# Patient Record
Sex: Male | Born: 1980 | Race: Black or African American | Hispanic: No | Marital: Single | Smoking: Never smoker
Health system: Southern US, Community
[De-identification: ages and names within clinical notes are randomized; demographics above are authoritative.]

## PROBLEM LIST (undated history)

## (undated) DIAGNOSIS — R1310 Dysphagia, unspecified: Secondary | ICD-10-CM

## (undated) DIAGNOSIS — S52502A Unspecified fracture of the lower end of left radius, initial encounter for closed fracture: Secondary | ICD-10-CM

## (undated) DIAGNOSIS — R198 Other specified symptoms and signs involving the digestive system and abdomen: Secondary | ICD-10-CM

---

## 2016-11-04 ENCOUNTER — Emergency Department: Payer: BLUE CROSS/BLUE SHIELD

## 2016-11-04 ENCOUNTER — Emergency Department
Admission: EM | Admit: 2016-11-04 | Discharge: 2016-11-04 | Disposition: A | Discharge status: Home / Self Care | Payer: BLUE CROSS/BLUE SHIELD | Attending: Emergency Medicine | Admitting: Emergency Medicine

## 2016-11-04 ENCOUNTER — Encounter: Payer: Self-pay | Admitting: Emergency Medicine

## 2016-11-04 DIAGNOSIS — W01198A Fall on same level from slipping, tripping and stumbling with subsequent striking against other object, initial encounter: Secondary | ICD-10-CM | POA: Insufficient documentation

## 2016-11-04 DIAGNOSIS — Y9389 Activity, other specified: Secondary | ICD-10-CM | POA: Insufficient documentation

## 2016-11-04 DIAGNOSIS — S0101XA Laceration without foreign body of scalp, initial encounter: Secondary | ICD-10-CM | POA: Diagnosis not present

## 2016-11-04 DIAGNOSIS — Y929 Unspecified place or not applicable: Secondary | ICD-10-CM | POA: Diagnosis not present

## 2016-11-04 DIAGNOSIS — S0990XA Unspecified injury of head, initial encounter: Secondary | ICD-10-CM | POA: Diagnosis present

## 2016-11-04 DIAGNOSIS — Y999 Unspecified external cause status: Secondary | ICD-10-CM | POA: Diagnosis not present
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
## 2016-11-21 ENCOUNTER — Encounter (HOSPITAL_COMMUNITY): Admission: EM | Disposition: A | Discharge status: Rehabilitation Facility | Payer: Self-pay | Source: Home / Self Care

## 2016-11-21 ENCOUNTER — Observation Stay (HOSPITAL_COMMUNITY): Payer: BLUE CROSS/BLUE SHIELD

## 2016-11-21 ENCOUNTER — Observation Stay (HOSPITAL_COMMUNITY): Payer: BLUE CROSS/BLUE SHIELD | Admitting: Anesthesiology

## 2016-11-21 ENCOUNTER — Emergency Department (HOSPITAL_COMMUNITY): Payer: BLUE CROSS/BLUE SHIELD

## 2016-11-21 ENCOUNTER — Encounter (HOSPITAL_COMMUNITY): Payer: Self-pay

## 2016-11-21 ENCOUNTER — Inpatient Hospital Stay (HOSPITAL_COMMUNITY)
Admission: EM | Admit: 2016-11-21 | Discharge: 2016-11-27 | DRG: 480 | Disposition: A | Discharge status: Rehabilitation Facility | Payer: BLUE CROSS/BLUE SHIELD | Attending: Orthopedic Surgery | Admitting: Orthopedic Surgery

## 2016-11-21 DIAGNOSIS — S52502A Unspecified fracture of the lower end of left radius, initial encounter for closed fracture: Secondary | ICD-10-CM

## 2016-11-21 DIAGNOSIS — S52572A Other intraarticular fracture of lower end of left radius, initial encounter for closed fracture: Secondary | ICD-10-CM | POA: Diagnosis not present

## 2016-11-21 DIAGNOSIS — S7292XA Unspecified fracture of left femur, initial encounter for closed fracture: Secondary | ICD-10-CM

## 2016-11-21 DIAGNOSIS — R52 Pain, unspecified: Secondary | ICD-10-CM

## 2016-11-21 DIAGNOSIS — Z419 Encounter for procedure for purposes other than remedying health state, unspecified: Secondary | ICD-10-CM

## 2016-11-21 DIAGNOSIS — S72142A Displaced intertrochanteric fracture of left femur, initial encounter for closed fracture: Secondary | ICD-10-CM

## 2016-11-21 DIAGNOSIS — G8918 Other acute postprocedural pain: Secondary | ICD-10-CM

## 2016-11-21 DIAGNOSIS — R739 Hyperglycemia, unspecified: Secondary | ICD-10-CM | POA: Diagnosis present

## 2016-11-21 DIAGNOSIS — R42 Dizziness and giddiness: Secondary | ICD-10-CM

## 2016-11-21 DIAGNOSIS — I951 Orthostatic hypotension: Secondary | ICD-10-CM

## 2016-11-21 DIAGNOSIS — S72352A Displaced comminuted fracture of shaft of left femur, initial encounter for closed fracture: Secondary | ICD-10-CM | POA: Diagnosis present

## 2016-11-21 DIAGNOSIS — S80211A Abrasion, right knee, initial encounter: Secondary | ICD-10-CM | POA: Diagnosis present

## 2016-11-21 DIAGNOSIS — S90511A Abrasion, right ankle, initial encounter: Secondary | ICD-10-CM | POA: Diagnosis present

## 2016-11-21 DIAGNOSIS — D62 Acute posthemorrhagic anemia: Secondary | ICD-10-CM | POA: Diagnosis not present

## 2016-11-21 HISTORY — DX: Unspecified fracture of the lower end of left radius, initial encounter for closed fracture: S52.502A

## 2016-11-21 HISTORY — PX: CLOSED REDUCTION WRIST FRACTURE: SHX1091

## 2016-11-21 HISTORY — PX: FEMUR IM NAIL: SHX1597

## 2016-11-21 LAB — SAMPLE TO BLOOD BANK

## 2016-11-21 SURGERY — INSERTION, INTRAMEDULLARY ROD, FEMUR
Anesthesia: General | Site: Wrist | Laterality: Left

## 2016-11-21 MED ORDER — MORPHINE SULFATE (PF) 4 MG/ML IV SOLN
1.0000 mg | INTRAVENOUS | Status: DC | PRN
  Administered 2016-11-22: 4 mg via INTRAVENOUS
  Filled 2016-11-21: qty 1

## 2016-11-21 MED ORDER — MEPERIDINE HCL 25 MG/ML IJ SOLN
INTRAMUSCULAR | Status: AC
  Administered 2016-11-21: 12.5 mg via INTRAVENOUS
  Filled 2016-11-21: qty 1

## 2016-11-21 MED ORDER — OXYCODONE HCL 5 MG/5ML PO SOLN: 5.0000 mg | Freq: Once | ORAL | Status: DC | PRN

## 2016-11-21 MED ORDER — SODIUM CHLORIDE 0.9 % IV SOLN: INTRAVENOUS | Status: DC

## 2016-11-21 MED ORDER — HYDROMORPHONE HCL 1 MG/ML IJ SOLN
0.2500 mg | INTRAMUSCULAR | Status: DC | PRN
  Administered 2016-11-21: 0.5 mg via INTRAVENOUS

## 2016-11-21 MED ORDER — BISACODYL 10 MG RE SUPP: 10.0000 mg | Freq: Every day | RECTAL | Status: DC | PRN

## 2016-11-21 MED ORDER — ONDANSETRON HCL 4 MG PO TABS: 4.0000 mg | ORAL_TABLET | Freq: Four times a day (QID) | ORAL | Status: DC | PRN

## 2016-11-21 MED ORDER — IOPAMIDOL (ISOVUE-300) INJECTION 61%
INTRAVENOUS | Status: AC
  Administered 2016-11-21: 100 mL
  Filled 2016-11-21: qty 100

## 2016-11-21 MED ORDER — MAGNESIUM CITRATE PO SOLN: 1.0000 | Freq: Once | ORAL | Status: DC | PRN

## 2016-11-21 MED ORDER — METOCLOPRAMIDE HCL 5 MG/ML IJ SOLN: 5.0000 mg | Freq: Three times a day (TID) | INTRAMUSCULAR | Status: DC | PRN

## 2016-11-21 MED ORDER — MIDAZOLAM HCL 5 MG/5ML IJ SOLN
INTRAMUSCULAR | Status: DC | PRN
  Administered 2016-11-21: 2 mg via INTRAVENOUS

## 2016-11-21 MED ORDER — LIDOCAINE 2% (20 MG/ML) 5 ML SYRINGE
INTRAMUSCULAR | Status: DC | PRN
  Administered 2016-11-21: 100 mg via INTRAVENOUS

## 2016-11-21 MED ORDER — DEXAMETHASONE SODIUM PHOSPHATE 10 MG/ML IJ SOLN
INTRAMUSCULAR | Status: DC | PRN
  Administered 2016-11-21: 10 mg via INTRAVENOUS

## 2016-11-21 MED ORDER — HYDROMORPHONE HCL 1 MG/ML IJ SOLN
INTRAMUSCULAR | Status: AC
  Administered 2016-11-21: 0.5 mg via INTRAVENOUS
  Filled 2016-11-21: qty 1

## 2016-11-21 MED ORDER — SODIUM CHLORIDE 0.9 % IV BOLUS (SEPSIS)
1000.0000 mL | Freq: Once | INTRAVENOUS | Status: AC
  Administered 2016-11-21: 1000 mL via INTRAVENOUS

## 2016-11-21 MED ORDER — MEPERIDINE HCL 25 MG/ML IJ SOLN
6.2500 mg | INTRAMUSCULAR | Status: DC | PRN
  Administered 2016-11-21: 12.5 mg via INTRAVENOUS

## 2016-11-21 MED ORDER — SODIUM CHLORIDE 0.9 % IV SOLN
INTRAVENOUS | Status: DC
  Administered 2016-11-21 – 2016-11-23 (×2): via INTRAVENOUS

## 2016-11-21 MED ORDER — PHENYLEPHRINE HCL 10 MG/ML IJ SOLN
INTRAMUSCULAR | Status: DC | PRN
Start: 2016-11-21 — End: 2016-11-21
  Administered 2016-11-21 (×2): 80 ug via INTRAVENOUS

## 2016-11-21 MED ORDER — METOPROLOL TARTRATE 5 MG/5ML IV SOLN
INTRAVENOUS | Status: DC | PRN
  Administered 2016-11-21: 1 mg via INTRAVENOUS

## 2016-11-21 MED ORDER — METHOCARBAMOL 500 MG PO TABS
500.0000 mg | ORAL_TABLET | Freq: Four times a day (QID) | ORAL | Status: DC | PRN
  Administered 2016-11-22 – 2016-11-27 (×8): 500 mg via ORAL
  Filled 2016-11-21 (×8): qty 1

## 2016-11-21 MED ORDER — SUFENTANIL CITRATE 50 MCG/ML IV SOLN
INTRAVENOUS | Status: AC
  Filled 2016-11-21: qty 1

## 2016-11-21 MED ORDER — PROMETHAZINE HCL 25 MG/ML IJ SOLN: 6.2500 mg | INTRAMUSCULAR | Status: DC | PRN

## 2016-11-21 MED ORDER — KETOROLAC TROMETHAMINE 30 MG/ML IJ SOLN
30.0000 mg | Freq: Once | INTRAMUSCULAR | Status: DC | PRN
  Administered 2016-11-21: 30 mg via INTRAVENOUS

## 2016-11-21 MED ORDER — PROPOFOL 10 MG/ML IV BOLUS
INTRAVENOUS | Status: DC | PRN
  Administered 2016-11-21: 150 mg via INTRAVENOUS
  Administered 2016-11-21: 50 mg via INTRAVENOUS

## 2016-11-21 MED ORDER — METHOCARBAMOL 1000 MG/10ML IJ SOLN
500.0000 mg | Freq: Four times a day (QID) | INTRAVENOUS | Status: DC | PRN
  Filled 2016-11-21: qty 5

## 2016-11-21 MED ORDER — OXYCODONE HCL 5 MG PO TABS
5.0000 mg | ORAL_TABLET | ORAL | Status: DC | PRN
  Administered 2016-11-22 (×3): 10 mg via ORAL
  Administered 2016-11-22: 5 mg via ORAL
  Administered 2016-11-23 – 2016-11-27 (×15): 10 mg via ORAL
  Filled 2016-11-21 (×19): qty 2

## 2016-11-21 MED ORDER — FENTANYL CITRATE (PF) 100 MCG/2ML IJ SOLN
INTRAMUSCULAR | Status: AC
  Filled 2016-11-21: qty 2

## 2016-11-21 MED ORDER — CEFAZOLIN SODIUM 1 G IJ SOLR
INTRAMUSCULAR | Status: AC
  Filled 2016-11-21: qty 20

## 2016-11-21 MED ORDER — FENTANYL CITRATE (PF) 100 MCG/2ML IJ SOLN
INTRAMUSCULAR | Status: AC | PRN
  Administered 2016-11-21 (×2): 50 ug via INTRAVENOUS

## 2016-11-21 MED ORDER — SODIUM CHLORIDE 0.45 % IV SOLN: INTRAVENOUS | Status: DC

## 2016-11-21 MED ORDER — DOCUSATE SODIUM 100 MG PO CAPS
100.0000 mg | ORAL_CAPSULE | Freq: Two times a day (BID) | ORAL | Status: DC
  Administered 2016-11-22 – 2016-11-26 (×9): 100 mg via ORAL
  Filled 2016-11-21 (×10): qty 1

## 2016-11-21 MED ORDER — 0.9 % SODIUM CHLORIDE (POUR BTL) OPTIME
TOPICAL | Status: DC | PRN
  Administered 2016-11-21: 1000 mL

## 2016-11-21 MED ORDER — LACTATED RINGERS IV SOLN
INTRAVENOUS | Status: DC | PRN
  Administered 2016-11-21 (×2): via INTRAVENOUS

## 2016-11-21 MED ORDER — KETOROLAC TROMETHAMINE 30 MG/ML IJ SOLN
INTRAMUSCULAR | Status: AC
  Administered 2016-11-21: 30 mg via INTRAVENOUS
  Filled 2016-11-21: qty 1

## 2016-11-21 MED ORDER — ONDANSETRON HCL 4 MG/2ML IJ SOLN
INTRAMUSCULAR | Status: DC | PRN
  Administered 2016-11-21: 4 mg via INTRAVENOUS

## 2016-11-21 MED ORDER — HYDROMORPHONE HCL 1 MG/ML IJ SOLN
1.0000 mg | INTRAMUSCULAR | Status: DC | PRN
  Administered 2016-11-22: 1 mg via INTRAVENOUS
  Filled 2016-11-21: qty 1

## 2016-11-21 MED ORDER — METOCLOPRAMIDE HCL 5 MG PO TABS: 5.0000 mg | ORAL_TABLET | Freq: Three times a day (TID) | ORAL | Status: DC | PRN

## 2016-11-21 MED ORDER — DOCUSATE SODIUM 100 MG PO CAPS: 100.0000 mg | ORAL_CAPSULE | Freq: Two times a day (BID) | ORAL | Status: DC

## 2016-11-21 MED ORDER — ACETAMINOPHEN 325 MG PO TABS: 650.0000 mg | ORAL_TABLET | Freq: Four times a day (QID) | ORAL | Status: DC | PRN

## 2016-11-21 MED ORDER — CEFAZOLIN SODIUM 1 G IJ SOLR
INTRAMUSCULAR | Status: DC | PRN
  Administered 2016-11-21: 2 g via INTRAMUSCULAR

## 2016-11-21 MED ORDER — ONDANSETRON HCL 4 MG/2ML IJ SOLN: 4.0000 mg | Freq: Four times a day (QID) | INTRAMUSCULAR | Status: DC | PRN

## 2016-11-21 MED ORDER — CEFAZOLIN IN D5W 1 GM/50ML IV SOLN
1.0000 g | Freq: Four times a day (QID) | INTRAVENOUS | Status: AC
  Administered 2016-11-22 (×3): 1 g via INTRAVENOUS
  Filled 2016-11-21 (×3): qty 50

## 2016-11-21 MED ORDER — ACETAMINOPHEN 650 MG RE SUPP
650.0000 mg | Freq: Four times a day (QID) | RECTAL | Status: DC | PRN
Start: 2016-11-21 — End: 2016-11-22

## 2016-11-21 MED ORDER — OXYCODONE HCL 5 MG PO TABS: 5.0000 mg | ORAL_TABLET | Freq: Once | ORAL | Status: DC | PRN

## 2016-11-21 MED ORDER — MIDAZOLAM HCL 2 MG/2ML IJ SOLN
INTRAMUSCULAR | Status: AC
  Filled 2016-11-21: qty 2

## 2016-11-21 MED ORDER — SUFENTANIL CITRATE 50 MCG/ML IV SOLN
INTRAVENOUS | Status: DC | PRN
  Administered 2016-11-21: 20 ug via INTRAVENOUS

## 2016-11-21 MED ORDER — POLYETHYLENE GLYCOL 3350 17 G PO PACK: 17.0000 g | PACK | Freq: Every day | ORAL | Status: DC | PRN

## 2016-11-21 MED ORDER — SUCCINYLCHOLINE CHLORIDE 200 MG/10ML IV SOSY
PREFILLED_SYRINGE | INTRAVENOUS | Status: DC | PRN
  Administered 2016-11-21: 120 mg via INTRAVENOUS

## 2016-11-21 MED ORDER — HYDROMORPHONE HCL 1 MG/ML IJ SOLN
2.0000 mg | Freq: Once | INTRAMUSCULAR | Status: AC
  Administered 2016-11-21: 2 mg via INTRAVENOUS
  Filled 2016-11-21: qty 2

## 2016-11-21 SURGICAL SUPPLY — 79 items
BANDAGE ACE 3X5.8 VEL STRL LF (GAUZE/BANDAGES/DRESSINGS) ×5 IMPLANT
BANDAGE ACE 4X5 VEL STRL LF (GAUZE/BANDAGES/DRESSINGS) ×5 IMPLANT
BIT DRILL 4.2 (DRILL) ×3 IMPLANT
BIT DRILL FLUTED FEMUR 4.2/3 (BIT) ×5 IMPLANT
BLADE SURG 15 STRL LF DISP TIS (BLADE) ×3 IMPLANT
BLADE SURG 15 STRL SS (BLADE) ×2
BNDG COHESIVE 4X5 TAN STRL (GAUZE/BANDAGES/DRESSINGS) ×5 IMPLANT
BNDG COHESIVE 6X5 TAN STRL LF (GAUZE/BANDAGES/DRESSINGS) ×10 IMPLANT
BNDG ESMARK 4X9 LF (GAUZE/BANDAGES/DRESSINGS) IMPLANT
BNDG GAUZE ELAST 4 BULKY (GAUZE/BANDAGES/DRESSINGS) ×5 IMPLANT
CLOSURE WOUND 1/2 X4 (GAUZE/BANDAGES/DRESSINGS)
COVER BACK TABLE 60X90IN (DRAPES) ×5 IMPLANT
COVER PERINEAL POST (MISCELLANEOUS) ×5 IMPLANT
COVER SURGICAL LIGHT HANDLE (MISCELLANEOUS) ×10 IMPLANT
CUFF TOURNIQUET SINGLE 18IN (TOURNIQUET CUFF) IMPLANT
CUFF TOURNIQUET SINGLE 24IN (TOURNIQUET CUFF) IMPLANT
DRAPE C-ARM 42X72 X-RAY (DRAPES) IMPLANT
DRAPE IMP U-DRAPE 54X76 (DRAPES) ×5 IMPLANT
DRAPE INCISE IOBAN 66X45 STRL (DRAPES) IMPLANT
DRAPE OEC MINIVIEW 54X84 (DRAPES) IMPLANT
DRAPE STERI IOBAN 125X83 (DRAPES) ×5 IMPLANT
DRAPE U-SHAPE 47X51 STRL (DRAPES) IMPLANT
DRILL 4.2 (DRILL) ×5
DRSG ADAPTIC 3X8 NADH LF (GAUZE/BANDAGES/DRESSINGS) ×5 IMPLANT
DRSG AQUACEL AG ADV 3.5X 4 (GAUZE/BANDAGES/DRESSINGS) ×5 IMPLANT
DRSG AQUACEL AG ADV 3.5X 6 (GAUZE/BANDAGES/DRESSINGS) ×5 IMPLANT
DRSG EMULSION OIL 3X3 NADH (GAUZE/BANDAGES/DRESSINGS) IMPLANT
DRSG MEPILEX BORDER 4X4 (GAUZE/BANDAGES/DRESSINGS) ×5 IMPLANT
DRSG MEPILEX BORDER 4X8 (GAUZE/BANDAGES/DRESSINGS) ×5 IMPLANT
DRSG PAD ABDOMINAL 8X10 ST (GAUZE/BANDAGES/DRESSINGS) IMPLANT
DURAPREP 26ML APPLICATOR (WOUND CARE) ×5 IMPLANT
ELECT REM PT RETURN 9FT ADLT (ELECTROSURGICAL) ×5
ELECTRODE REM PT RTRN 9FT ADLT (ELECTROSURGICAL) ×3 IMPLANT
EVACUATOR 1/8 PVC DRAIN (DRAIN) IMPLANT
GAUZE SPONGE 4X4 12PLY STRL (GAUZE/BANDAGES/DRESSINGS) IMPLANT
GAUZE SPONGE 4X4 16PLY XRAY LF (GAUZE/BANDAGES/DRESSINGS) IMPLANT
GLOVE BIOGEL PI IND STRL 9 (GLOVE) ×3 IMPLANT
GLOVE BIOGEL PI INDICATOR 9 (GLOVE) ×2
GLOVE SURG ORTHO 9.0 STRL STRW (GLOVE) ×5 IMPLANT
GOWN STRL REUS W/ TWL XL LVL3 (GOWN DISPOSABLE) ×9 IMPLANT
GOWN STRL REUS W/TWL XL LVL3 (GOWN DISPOSABLE) ×6
KIT BASIN OR (CUSTOM PROCEDURE TRAY) ×5 IMPLANT
KIT ROOM TURNOVER OR (KITS) ×5 IMPLANT
LINER BOOT UNIVERSAL DISP (MISCELLANEOUS) IMPLANT
MANIFOLD NEPTUNE II (INSTRUMENTS) IMPLANT
NAIL FEM RECON 10X420 LT (Nail) ×5 IMPLANT
NS IRRIG 1000ML POUR BTL (IV SOLUTION) ×5 IMPLANT
PACK GENERAL/GYN (CUSTOM PROCEDURE TRAY) ×5 IMPLANT
PACK ORTHO EXTREMITY (CUSTOM PROCEDURE TRAY) IMPLANT
PAD ARMBOARD 7.5X6 YLW CONV (MISCELLANEOUS) ×20 IMPLANT
PAD CAST 4YDX4 CTTN HI CHSV (CAST SUPPLIES) IMPLANT
PADDING CAST COTTON 4X4 STRL (CAST SUPPLIES)
REAMER ROD DEEP FLUTE 2.5X950 (INSTRUMENTS) ×5 IMPLANT
SCREW LOCK T25 FT 60X5X4.3X (Screw) ×3 IMPLANT
SCREW LOCKING 5.0MMX40MM (Screw) ×5 IMPLANT
SCREW LOCKING 5.0X60MM (Screw) ×2 IMPLANT
SPLINT FIBERGLASS 3X35 (CAST SUPPLIES) ×5 IMPLANT
SPONGE LAP 18X18 X RAY DECT (DISPOSABLE) ×5 IMPLANT
STAPLER VISISTAT 35W (STAPLE) ×5 IMPLANT
STRIP CLOSURE SKIN 1/2X4 (GAUZE/BANDAGES/DRESSINGS) IMPLANT
SUCTION FRAZIER HANDLE 10FR (MISCELLANEOUS)
SUCTION TUBE FRAZIER 10FR DISP (MISCELLANEOUS) IMPLANT
SUT PROLENE 3 0 PS 1 (SUTURE) ×5 IMPLANT
SUT VIC AB 0 CT1 27 (SUTURE) ×2
SUT VIC AB 0 CT1 27XBRD ANBCTR (SUTURE) ×3 IMPLANT
SUT VIC AB 1 CT1 27 (SUTURE) ×2
SUT VIC AB 1 CT1 27XBRD ANBCTR (SUTURE) ×3 IMPLANT
SUT VIC AB 2-0 CT1 27 (SUTURE) ×2
SUT VIC AB 2-0 CT1 TAPERPNT 27 (SUTURE) ×3 IMPLANT
SUT VIC AB 2-0 CTB1 (SUTURE) IMPLANT
SUT VIC AB 3-0 X1 27 (SUTURE) ×5 IMPLANT
SUT VICRYL 4-0 PS2 18IN ABS (SUTURE) IMPLANT
SYR BULB 3OZ (MISCELLANEOUS) IMPLANT
TOWEL OR 17X24 6PK STRL BLUE (TOWEL DISPOSABLE) ×5 IMPLANT
TOWEL OR 17X26 10 PK STRL BLUE (TOWEL DISPOSABLE) ×5 IMPLANT
TUBE CONNECTING 12'X1/4 (SUCTIONS)
TUBE CONNECTING 12X1/4 (SUCTIONS) IMPLANT
WATER STERILE IRR 1000ML POUR (IV SOLUTION) IMPLANT
YANKAUER SUCT BULB TIP NO VENT (SUCTIONS) IMPLANT

## 2016-11-21 NOTE — ED Notes (Signed)
Attempted report x 2 

## 2016-11-21 NOTE — ED Notes (Signed)
Pt clothing and cell phone given to father.
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
## 2016-11-21 NOTE — Anesthesia Preprocedure Evaluation (Signed)
Anesthesia Evaluation  Patient identified by MRN, date of birth, ID band Patient awake    Reviewed: Allergy & Precautions, NPO status , Patient's Chart, lab work & pertinent test results  Airway Mallampati: II  TM Distance: >3 FB Neck ROM: Full    Dental no notable dental hx.    Pulmonary neg pulmonary ROS,    Pulmonary exam normal breath sounds clear to auscultation       Cardiovascular negative cardio ROS Normal cardiovascular exam Rhythm:Regular Rate:Normal     Neuro/Psych negative neurological ROS  negative psych ROS   GI/Hepatic negative GI ROS, Neg liver ROS,   Endo/Other  negative endocrine ROS  Renal/GU negative Renal ROS     Musculoskeletal negative musculoskeletal ROS (+)   Abdominal   Peds  Hematology negative hematology ROS (+)   Anesthesia Other Findings   Reproductive/Obstetrics negative OB ROS                             Anesthesia Physical Anesthesia Plan  ASA: II  Anesthesia Plan: General   Post-op Pain Management:    Induction: Intravenous  Airway Management Planned: Oral ETT  Additional Equipment:   Intra-op Plan:   Post-operative Plan: Extubation in OR  Informed Consent: I have reviewed the patients History and Physical, chart, labs and discussed the procedure including the risks, benefits and alternatives for the proposed anesthesia with the patient or authorized representative who has indicated his/her understanding and acceptance.   Dental advisory given  Plan Discussed with: CRNA  Anesthesia Plan Comments:         Anesthesia Quick Evaluation  

## 2016-11-21 NOTE — Progress Notes (Signed)
Orthopedic Tech Progress Note Patient Details:  Tramain Gershman 28-Sep-1980 [id number]  Musculoskeletal Traction Type of Traction: Gilberto Better Traction Traction Location: Murlean Caller, Lonzo Saulter 11/21/2016, 4:12 PM

## 2016-11-21 NOTE — Anesthesia Procedure Notes (Signed)
Procedure Name: Intubation Date/Time: 11/21/2016 7:25 PM Performed by: Claris Che Pre-anesthesia Checklist: Patient identified, Emergency Drugs available, Suction available, Patient being monitored and Timeout performed Patient Re-evaluated:Patient Re-evaluated prior to inductionOxygen Delivery Method: Circle system utilized Preoxygenation: Pre-oxygenation with 100% oxygen Intubation Type: IV induction, Rapid sequence and Cricoid Pressure applied Laryngoscope Size: Mac and 3 Grade View: Grade II Tube type: Oral Tube size: 7.5 mm Airway Equipment and Method: Stylet Placement Confirmation: ETT inserted through vocal cords under direct vision,  positive ETCO2 and breath sounds checked- equal and bilateral Secured at: 23 cm Tube secured with: Tape Dental Injury: Teeth and Oropharynx as per pre-operative assessment

## 2016-11-21 NOTE — Transfer of Care (Signed)
Immediate Anesthesia Transfer of Care Note  Patient: Brandon Holloway  Procedure(s) Performed: Procedure(s): INTRAMEDULLARY (IM) NAIL FEMORAL (Left) CLOSED REDUCTION WRIST (Left)  Patient Location: PACU  Anesthesia Type:General  Level of Consciousness: sedated, patient cooperative and responds to stimulation  Airway & Oxygen Therapy: Patient Spontanous Breathing and Patient connected to nasal cannula oxygen  Post-op Assessment: Report given to RN, Post -op Vital signs reviewed and stable and Patient moving all extremities X 4  Post vital signs: Reviewed and stable  Last Vitals:  Vitals:   11/21/16 1730 11/21/16 1745  BP: 111/67 118/77  Pulse: 99 98  Resp: (!) 24 17  Temp:      Last Pain:  Vitals:   11/21/16 1655  TempSrc:   PainSc: 9          Complications: No apparent anesthesia complications

## 2016-11-21 NOTE — Op Note (Signed)
[id number] 
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
## 2016-11-21 NOTE — ED Notes (Signed)
Report given to OR RN.

## 2016-11-21 NOTE — Op Note (Signed)
[name], Brandon Holloway NO.:  [id number]  MEDICAL RECORD NO.:  [id number]  LOCATION:  TRAAC                        FACILITY:  [facility]  PHYSICIAN:  Betha Loa, MD        DATE OF BIRTH:  05/03/1981  DATE OF PROCEDURE:  11/21/2016 DATE OF DISCHARGE:                              OPERATIVE REPORT   PREOPERATIVE DIAGNOSIS:  Left comminuted intraarticular distal radius fracture.  POSTOPERATIVE DIAGNOSIS:  Left comminuted intraarticular distal radius fracture.  PROCEDURE:  Closed reduction and splinting of left comminuted intraarticular distal radius fracture.  SURGEON:  Betha Loa, MD.  ASSISTANT:  None.  ANESTHESIA:  General.  IV FLUIDS:  Per anesthesia flow sheet.  ESTIMATED BLOOD LOSS:  None.  COMPLICATIONS:  None.  SPECIMENS:  None.  TOURNIQUET:  None.  DISPOSITION:  Remains in OR for further procedures.  OPERATIVE COURSE:  After being identified preoperatively by myself, the patient and I agreed upon procedure and site of procedure.  Surgical site was marked.  The risks, benefits, and alternatives of surgery were reviewed and he wished to proceed.  Surgical consent had been signed. He was transferred to the operating room and placed on the operating room table.  General anesthesia induced by Anesthesiology.  A surgical pause was performed between surgeons, anesthesia, and operating staff; and all were in agreement as to the patient, procedure and site of procedure.  C-arm was used in AP and lateral projections throughout the procedure.  A closed reduction of the left comminuted intraarticular distal radius fracture was performed.  There was displacement of his carpus.  This was reduced.  A sugar-tong splint was placed.  Radiographs were taken through the splint showed acceptable maintained reduction. Fingertips were all pink with brisk capillary refill after reduction and splinting.  He tolerated the procedure well.  He remained in the operating  room for further procedures on his lower extremity, which will be dictated under a separate note.  We anticipate further surgical procedure on the distal radius for operative stabilization at a later date.     Betha Loa, MD     KK/MEDQ  D:  11/21/2016  T:  11/21/2016  Job:  [id number]
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
## 2016-11-21 NOTE — Progress Notes (Signed)
Orthopedic Tech Progress Note Patient Details:  [name] Jun 06, 1981 [id number]  Patient ID: [name], male   DOB: 06-05-1981, 36 y.o.   MRN: [id number]   [name] 11/21/2016, 4:13 PM As ordered by Dr. Fayrene Fearing wYATT
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
## 2016-11-21 NOTE — ED Notes (Signed)
Attempted report x1. 

## 2016-11-22 ENCOUNTER — Encounter (HOSPITAL_COMMUNITY): Payer: Self-pay

## 2016-11-22 ENCOUNTER — Observation Stay (HOSPITAL_COMMUNITY): Payer: BLUE CROSS/BLUE SHIELD

## 2016-11-22 DIAGNOSIS — S8992XA Unspecified injury of left lower leg, initial encounter: Secondary | ICD-10-CM | POA: Diagnosis not present

## 2016-11-22 DIAGNOSIS — D62 Acute posthemorrhagic anemia: Secondary | ICD-10-CM | POA: Diagnosis not present

## 2016-11-22 DIAGNOSIS — R42 Dizziness and giddiness: Secondary | ICD-10-CM | POA: Diagnosis not present

## 2016-11-22 DIAGNOSIS — F4323 Adjustment disorder with mixed anxiety and depressed mood: Secondary | ICD-10-CM | POA: Diagnosis not present

## 2016-11-22 DIAGNOSIS — R269 Unspecified abnormalities of gait and mobility: Secondary | ICD-10-CM | POA: Diagnosis not present

## 2016-11-22 DIAGNOSIS — I951 Orthostatic hypotension: Secondary | ICD-10-CM | POA: Diagnosis not present

## 2016-11-22 DIAGNOSIS — E46 Unspecified protein-calorie malnutrition: Secondary | ICD-10-CM | POA: Diagnosis not present

## 2016-11-22 DIAGNOSIS — S52572A Other intraarticular fracture of lower end of left radius, initial encounter for closed fracture: Secondary | ICD-10-CM | POA: Diagnosis present

## 2016-11-22 DIAGNOSIS — R7309 Other abnormal glucose: Secondary | ICD-10-CM | POA: Diagnosis not present

## 2016-11-22 DIAGNOSIS — R52 Pain, unspecified: Secondary | ICD-10-CM | POA: Diagnosis present

## 2016-11-22 DIAGNOSIS — S72352A Displaced comminuted fracture of shaft of left femur, initial encounter for closed fracture: Secondary | ICD-10-CM | POA: Diagnosis present

## 2016-11-22 DIAGNOSIS — R739 Hyperglycemia, unspecified: Secondary | ICD-10-CM | POA: Diagnosis present

## 2016-11-22 DIAGNOSIS — S99929A Unspecified injury of unspecified foot, initial encounter: Secondary | ICD-10-CM | POA: Diagnosis not present

## 2016-11-22 DIAGNOSIS — S7292XA Unspecified fracture of left femur, initial encounter for closed fracture: Secondary | ICD-10-CM | POA: Diagnosis not present

## 2016-11-22 DIAGNOSIS — S52502A Unspecified fracture of the lower end of left radius, initial encounter for closed fracture: Secondary | ICD-10-CM | POA: Diagnosis not present

## 2016-11-22 DIAGNOSIS — S52502S Unspecified fracture of the lower end of left radius, sequela: Secondary | ICD-10-CM | POA: Diagnosis not present

## 2016-11-22 DIAGNOSIS — S7292XS Unspecified fracture of left femur, sequela: Secondary | ICD-10-CM | POA: Diagnosis not present

## 2016-11-22 DIAGNOSIS — G8918 Other acute postprocedural pain: Secondary | ICD-10-CM | POA: Diagnosis not present

## 2016-11-22 DIAGNOSIS — S80211A Abrasion, right knee, initial encounter: Secondary | ICD-10-CM | POA: Diagnosis present

## 2016-11-22 DIAGNOSIS — S90511A Abrasion, right ankle, initial encounter: Secondary | ICD-10-CM | POA: Diagnosis present

## 2016-11-22 DIAGNOSIS — S8990XA Unspecified injury of unspecified lower leg, initial encounter: Secondary | ICD-10-CM | POA: Diagnosis not present

## 2016-11-22 DIAGNOSIS — S99919A Unspecified injury of unspecified ankle, initial encounter: Secondary | ICD-10-CM | POA: Diagnosis not present

## 2016-11-22 LAB — CBC
HEMATOCRIT: 34.8 % — AB (ref 39.0–52.0)
HEMATOCRIT: 33.1 % — AB (ref 39.0–52.0)
HEMOGLOBIN: 10.8 g/dL — AB (ref 13.0–17.0)
Hemoglobin: 11.7 g/dL — ABNORMAL LOW (ref 13.0–17.0)
MCH: 31.4 pg (ref 26.0–34.0)
MCH: 30.5 pg (ref 26.0–34.0)
MCHC: 32.6 g/dL (ref 30.0–36.0)
MCHC: 33.6 g/dL (ref 30.0–36.0)
MCV: 93.3 fL (ref 78.0–100.0)
MCV: 93.5 fL (ref 78.0–100.0)
PLATELETS: 218 10*3/uL (ref 150–400)
Platelets: 221 10*3/uL (ref 150–400)
RBC: 3.54 MIL/uL — AB (ref 4.22–5.81)
RBC: 3.73 MIL/uL — ABNORMAL LOW (ref 4.22–5.81)
RDW: 11.6 % (ref 11.5–15.5)
RDW: 11.8 % (ref 11.5–15.5)
WBC: 7.8 10*3/uL (ref 4.0–10.5)
WBC: 9.5 10*3/uL (ref 4.0–10.5)

## 2016-11-22 LAB — URINALYSIS, ROUTINE W REFLEX MICROSCOPIC
BILIRUBIN URINE: NEGATIVE
Glucose, UA: NEGATIVE mg/dL
Ketones, ur: NEGATIVE mg/dL
LEUKOCYTES UA: NEGATIVE
Nitrite: NEGATIVE
PROTEIN: NEGATIVE mg/dL
SPECIFIC GRAVITY, URINE: 1.024 (ref 1.005–1.030)
SQUAMOUS EPITHELIAL / LPF: NONE SEEN
pH: 5 (ref 5.0–8.0)

## 2016-11-22 LAB — HIV ANTIBODY (ROUTINE TESTING W REFLEX): HIV Screen 4th Generation wRfx: NONREACTIVE

## 2016-11-22 LAB — CG4 I-STAT (LACTIC ACID): Lactic Acid, Venous: 2.49 mmol/L (ref 0.5–1.9)

## 2016-11-22 LAB — CDS SEROLOGY

## 2016-11-22 LAB — POCT I-STAT, CHEM 8
BUN: 21 mg/dL — AB (ref 6–20)
CALCIUM ION: 1.08 mmol/L — AB (ref 1.15–1.40)
CHLORIDE: 105 mmol/L (ref 101–111)
CREATININE: 1.2 mg/dL (ref 0.61–1.24)
GLUCOSE: 169 mg/dL — AB (ref 65–99)
HCT: 37 % — ABNORMAL LOW (ref 39.0–52.0)
Hemoglobin: 12.6 g/dL — ABNORMAL LOW (ref 13.0–17.0)
Potassium: 3.9 mmol/L (ref 3.5–5.1)
Sodium: 138 mmol/L (ref 135–145)
TCO2: 24 mmol/L (ref 0–100)

## 2016-11-22 LAB — ETHANOL: Alcohol, Ethyl (B): <5 mg/dL (ref <5)

## 2016-11-22 LAB — BASIC METABOLIC PANEL
Anion gap: 8 (ref 5–15)
BUN: 11 mg/dL (ref 6–20)
CO2: 24 mmol/L (ref 22–32)
Calcium: 8.3 mg/dL — ABNORMAL LOW (ref 8.9–10.3)
Chloride: 105 mmol/L (ref 101–111)
Creatinine, Ser: 0.89 mg/dL (ref 0.61–1.24)
GFR calc Af Amer: >60 mL/min (ref >60)
GFR calc non Af Amer: >60 mL/min (ref >60)
GLUCOSE: 135 mg/dL — AB (ref 65–99)
POTASSIUM: 4.2 mmol/L (ref 3.5–5.1)
SODIUM: 137 mmol/L (ref 135–145)

## 2016-11-22 LAB — PROTIME-INR
INR: 1.1
PROTHROMBIN TIME: 14.2 s (ref 11.4–15.2)

## 2016-11-22 MED ORDER — MORPHINE SULFATE (PF) 4 MG/ML IV SOLN
1.0000 mg | INTRAVENOUS | Status: DC | PRN
  Administered 2016-11-22 – 2016-11-23 (×3): 4 mg via INTRAVENOUS
  Administered 2016-11-23: 2 mg via INTRAVENOUS
  Administered 2016-11-23 (×2): 4 mg via INTRAVENOUS
  Filled 2016-11-22 (×7): qty 1

## 2016-11-22 MED ORDER — ENOXAPARIN SODIUM 40 MG/0.4ML ~~LOC~~ SOLN
40.0000 mg | SUBCUTANEOUS | Status: DC
  Administered 2016-11-22 – 2016-11-27 (×6): 40 mg via SUBCUTANEOUS
  Filled 2016-11-22 (×6): qty 0.4

## 2016-11-22 NOTE — Progress Notes (Addendum)
Patient ID: Brandon Holloway, male   DOB: 1981/05/14, 36 y.o.   MRN: [id number] Postoperative day 1 intramedullary nailing comminuted midshaft left femur fracture. Patient's leg is straight this morning there is no bleeding, touch down weight bearing left leg with platform walker left forearm
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
## 2016-11-22 NOTE — Anesthesia Postprocedure Evaluation (Signed)
Anesthesia Post Note  Patient: Brandon Holloway  Procedure(s) Performed: Procedure(s) (LRB): INTRAMEDULLARY (IM) NAIL FEMORAL (Left) CLOSED REDUCTION WRIST (Left)  Patient location during evaluation: PACU Anesthesia Type: General Level of consciousness: sedated and patient cooperative Pain management: pain level controlled Vital Signs Assessment: post-procedure vital signs reviewed and stable Respiratory status: spontaneous breathing Cardiovascular status: stable Anesthetic complications: no       Last Vitals:  Vitals:   11/21/16 2125 11/21/16 2200  BP: (!) 95/59 114/64  Pulse: 82 75  Resp: 14 14  Temp:  37.1 C    Last Pain:  Vitals:   11/22/16 0454  TempSrc:   PainSc: Asleep                 Lewie Loron
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
## 2016-11-23 ENCOUNTER — Encounter (HOSPITAL_COMMUNITY): Payer: Self-pay
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
## 2016-11-23 NOTE — Progress Notes (Signed)
Subjective: 2 Days Post-Op Procedure(s) (LRB): INTRAMEDULLARY (IM) NAIL FEMORAL (Left) CLOSED REDUCTION WRIST (Left) Patient reports pain as mild.    Objective: Vital signs in last 24 hours: Temp:  [98.3 F (36.8 C)-99.6 F (37.6 C)] 98.3 F (36.8 C) (04/21 0528) Pulse Rate:  [93-115] 93 (04/21 0528) Resp:  [14-18] 18 (04/21 0528) BP: (111-115)/(64-66) 111/64 (04/21 0528) SpO2:  [97 %-100 %] 98 % (04/21 0528)  Intake/Output from previous day: 04/20 0701 - 04/21 0700 In: 1204 [P.O.:960; I.V.:244] Out: 800 [Urine:800] Intake/Output this shift: No intake/output data recorded.   Recent Labs  11/21/16 1611 11/21/16 2347 11/22/16 0654  HGB 12.6* 11.7* 10.8*    Recent Labs  11/21/16 2347 11/22/16 0654  WBC 9.5 7.8  RBC 3.73* 3.54*  HCT 34.8* 33.1*  PLT 218 221    Recent Labs  11/21/16 1611 11/22/16 0654  NA 138 137  K 3.9 4.2  CL 105 105  CO2  --  24  BUN 21* 11  CREATININE 1.20 0.89  GLUCOSE 169* 135*  CALCIUM  --  8.3*    Recent Labs  11/21/16 2347  INR 1.10    Neurologically intact  Assessment/Plan: 2 Days Post-Op Procedure(s) (LRB): INTRAMEDULLARY (IM) NAIL FEMORAL (Left) CLOSED REDUCTION WRIST (Left) Up with therapy Presently on bedside commode-no complaints of left thigh pain-dressing dry Valeria Batman 11/23/2016, 10:29 AM

## 2016-11-23 NOTE — Progress Notes (Signed)
PT Cancellation Note  Patient Details Name: Brandon Holloway MRN: [id number] DOB: 1980/12/05   Cancelled Treatment:    Reason Eval/Treat Not Completed: Pain limiting ability to participate;Fatigue/lethargy limiting ability to participate.  Provided max encouragement to participate.  Patient reports he can't get OOB again today.  Will return tomorrow for PT session.   Vena Austria 11/23/2016, 8:58 PM Durenda Hurt. Renaldo Fiddler, [facility] Acute Rehab Services Pager [phone]
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
## 2016-11-23 NOTE — Progress Notes (Signed)
Inpatient Rehabilitation  Per therapy request, patient was screened by Fae Pippin for appropriateness for an Inpatient Acute Rehab consult.  If the team wishes for IP Rehab for post acute care then please order an Inpatient Rehab consult.    Charlane Ferretti., CCC/SLP Admission Coordinator  [facility] Inpatient Rehabilitation  Cell [phone]

## 2016-11-24 ENCOUNTER — Encounter (HOSPITAL_COMMUNITY): Payer: Self-pay

## 2016-11-24 MED ORDER — MORPHINE SULFATE (PF) 2 MG/ML IV SOLN: 1.0000 mg | INTRAVENOUS | Status: DC | PRN

## 2016-11-24 MED ORDER — ACETAMINOPHEN 325 MG PO TABS
650.0000 mg | ORAL_TABLET | Freq: Four times a day (QID) | ORAL | Status: DC
  Administered 2016-11-24 – 2016-11-27 (×13): 650 mg via ORAL
  Filled 2016-11-24 (×14): qty 2
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
## 2016-11-24 NOTE — Progress Notes (Signed)
Trauma Service Note  Subjective: No complaints. Thought he did better with PT but looks like OT has suggested CIR- he does not want that.   Objective: Vital signs in last 24 hours: Temp:  [98.9 F (37.2 C)-99.3 F (37.4 C)] 98.9 F (37.2 C) (04/22 0444) Pulse Rate:  [110-112] 112 (04/22 0444) Resp:  [18] 18 (04/22 0444) BP: (113-129)/(68-82) 113/82 (04/22 0444) SpO2:  [96 %-100 %] 96 % (04/22 0444) Last BM Date: 11/23/16  Intake/Output from previous day: 04/21 0701 - 04/22 0700 In: 1180 [P.O.:960; I.V.:220] Out: 400 [Urine:400] Intake/Output this shift: No intake/output data recorded.  General: Intact  Lungs: Clear  Abd: Benign  Extremities: No SCDs in place.  Great pulses bilaterally and distally. LUE splint.   Neuro: Intact  Lab Results: CBC   Recent Labs  11/21/16 2347 11/22/16 0654  WBC 9.5 7.8  HGB 11.7* 10.8*  HCT 34.8* 33.1*  PLT 218 221   BMET  Recent Labs  11/21/16 1611 11/22/16 0654  NA 138 137  K 3.9 4.2  CL 105 105  CO2  --  24  GLUCOSE 169* 135*  BUN 21* 11  CREATININE 1.20 0.89  CALCIUM  --  8.3*   PT/INR  Recent Labs  11/21/16 2347  LABPROT 14.2  INR 1.10   ABG No results for input(s): PHART, HCO3 in the last 72 hours.  Invalid input(s): PCO2, PO2  Studies/Results: No results found.  Anti-infectives: Anti-infectives    Start     Dose/Rate Route Frequency Ordered Stop   11/22/16 0200  ceFAZolin (ANCEF) IVPB 1 g/50 mL premix     1 g 100 mL/hr over 30 Minutes Intravenous Every 6 hours 11/21/16 2220 11/22/16 1516      Assessment/Plan: s/p Procedure(s): INTRAMEDULLARY (IM) NAIL FEMORAL CLOSED REDUCTION WRIST  Add tylenol Disconnect from IV pole PT/OT- see if he can do better today. If not will place CIR consult   LOS: 2 days   Berna Bue MD 11/24/2016

## 2016-11-24 NOTE — Progress Notes (Signed)
Physical Therapy Treatment Patient Details Name: Brandon Holloway MRN: [id number] DOB: 30-Jun-1981 Today's Date: 11/24/2016    History of Present Illness 36 y.o. male admitted to [facility] on 11/21/16 s/p [facility] (he and his girlfriend) with resultant L femur fx (s/p IM Nail) and L wrist fracture/dislocation (closed reduction and splinting under anesthesia) on day of admission.  Pt is now NWB L hand (can WB through a platform) and TDWB left leg. Pt with no significant PMhx.      PT Comments    Pt slowly progressing with mobility. He was able to ambulate to/from bathroom today with L platform walker. Mobility hindered by pain and decreased activity tolerance. Continue to recommend CIR at d/c. If pt refuses, he will at minimum need w/c, platform walker and 3in1 for home. He has 7 steps to enter his house. Bumping up on his bottom would be the most feasible and safest option.    Follow Up Recommendations  Supervision for mobility/OOB;CIR     Equipment Recommendations  Rolling walker with 5" wheels;3in1 (PT);Wheelchair (measurements PT);Wheelchair cushion (measurements PT);Other (comment) (platform attachment for RW)    Recommendations for Other Services       Precautions / Restrictions Precautions Precautions: Fall Restrictions Weight Bearing Restrictions: Yes LUE Weight Bearing: Non weight bearing LLE Weight Bearing: Touchdown weight bearing    Mobility  Bed Mobility         Supine to sit: HOB elevated;Mod assist     General bed mobility comments: +rail, verbal cues for sequencing, increased time  Transfers   Equipment used: Left platform walker   Sit to Stand: Mod assist Stand pivot transfers: Mod assist       General transfer comment: Pt with c/o mild dizziness with mobility. Verbal cues for hand placement and sequencing.  Ambulation/Gait Ambulation/Gait assistance: Min assist;+2 safety/equipment Ambulation Distance (Feet): 10 Feet (x 2) Assistive device: Left platform  walker Gait Pattern/deviations: Step-to pattern;Decreased stride length;Antalgic Gait velocity: decreased Gait velocity interpretation: Below normal speed for age/gender General Gait Details: Pt fatigues quickly. +2 utilized for chair follow.   Stairs            Wheelchair Mobility    Modified Rankin (Stroke Patients Only)       Balance   Sitting-balance support: Feet supported;No upper extremity supported Sitting balance-Leahy Scale: Good     Standing balance support: Bilateral upper extremity supported;During functional activity Standing balance-Leahy Scale: Fair                              Cognition Arousal/Alertness: Awake/alert Behavior During Therapy: WFL for tasks assessed/performed Overall Cognitive Status: Within Functional Limits for tasks assessed                                        Exercises      General Comments        Pertinent Vitals/Pain Pain Assessment: 0-10 Pain Score: 8  Pain Location: LUE/LE Pain Descriptors / Indicators: Aching;Throbbing Pain Intervention(s): Limited activity within patient's tolerance;Monitored during session;Repositioned    Home Living                      Prior Function            PT Goals (current goals can now be found in the care plan section) Acute Rehab PT Goals Patient Stated  Goal: to get better and have his girlfriend also get better.  PT Goal Formulation: With patient Time For Goal Achievement: 11/29/16 Potential to Achieve Goals: Good Progress towards PT goals: Progressing toward goals    Frequency    Min 5X/week      PT Plan Equipment recommendations need to be updated    Co-evaluation             End of Session Equipment Utilized During Treatment: Gait belt Activity Tolerance: Patient tolerated treatment well Patient left: in chair;with call bell/phone within reach;with family/visitor present Nurse Communication: Mobility status PT Visit  Diagnosis: Muscle weakness (generalized) (M62.81);Difficulty in walking, not elsewhere classified (R26.2);Pain Pain - Right/Left: Left Pain - part of body: Arm;Leg     Time: 1013-1100 PT Time Calculation (min) (ACUTE ONLY): 47 min  Charges:  $Gait Training: 8-22 mins $Therapeutic Activity: 23-37 mins                    G Codes:       Aida Raider, PT  Office # [phone] Pager [phone]    Ilda Foil 11/24/2016, 11:37 AM

## 2016-11-25 LAB — CBC
HCT: 29.6 % — ABNORMAL LOW (ref 39.0–52.0)
Hemoglobin: 9.7 g/dL — ABNORMAL LOW (ref 13.0–17.0)
MCH: 30.4 pg (ref 26.0–34.0)
MCHC: 32.8 g/dL (ref 30.0–36.0)
MCV: 92.8 fL (ref 78.0–100.0)
Platelets: 224 10*3/uL (ref 150–400)
RBC: 3.19 MIL/uL — ABNORMAL LOW (ref 4.22–5.81)
RDW: 11.4 % — AB (ref 11.5–15.5)
WBC: 6.7 10*3/uL (ref 4.0–10.5)
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
## 2016-11-25 NOTE — Progress Notes (Signed)
At 1 p.m. Mom stated that Pt did not have a bath in a few days. At 1:35 p.m. Tech entered room to assist Pt with bath. Pt talking with Physical therapy and stated he would like to go visit girlfriend instead of getting a bath at this moment.
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
## 2016-11-25 NOTE — Progress Notes (Signed)
Physical Therapy Treatment Patient Details Name: Brandon Holloway MRN: [id number] DOB: 1981/04/26 Today's Date: 11/25/2016    History of Present Illness 36 y.o. male admitted to [facility] on 11/21/16 s/p [facility] (he and his girlfriend) with resultant L femur fx (s/p IM Nail) and L wrist fracture/dislocation (closed reduction and splinting under anesthesia) on day of admission.  Pt is now NWB L hand (can WB through a platform) and TDWB left leg. Pt with no significant PMhx.      PT Comments    Pt able to increase ambulation distance (10' x 2).  Sit > stand from bed with MOD A and toilet with MAX A with cues for WB status.  Pt very focused on going to see girlfriend on different unit and declined a bath from nurse tech.  Pt was left in girlfriend's room, with both his nurse and her nurse aware. Rehab tech going to let them visit for 30-45 minutes and then take him back to his room. Pt was tearful during visit with his girlfriend.  Pt also had his nurse's number. Recommend CIR.    Follow Up Recommendations  CIR;Supervision for mobility/OOB     Equipment Recommendations  Rolling walker with 5" wheels;3in1 (PT);Wheelchair (measurements PT);Wheelchair cushion (measurements PT);Other (comment)    Recommendations for Other Services       Precautions / Restrictions Precautions Precautions: Fall Restrictions Weight Bearing Restrictions: Yes LUE Weight Bearing: Non weight bearing LLE Weight Bearing: Touchdown weight bearing    Mobility  Bed Mobility Overal bed mobility: Needs Assistance Bed Mobility: Supine to Sit     Supine to sit: Min assist;HOB elevated     General bed mobility comments: MIN A for L LE and cues for technique  Transfers Overall transfer level: Needs assistance Equipment used: Left platform walker Transfers: Sit to/from Stand Sit to Stand: Mod assist;Max assist         General transfer comment: MOD A from bed and MAX A from toilet (did not want BSC over toilet) cues for  safe technique and how to maintain WB restrictions  Ambulation/Gait Ambulation/Gait assistance: Min assist;+2 safety/equipment Ambulation Distance (Feet): 10 Feet (x2) Assistive device: Left platform walker Gait Pattern/deviations: Step-to pattern;Antalgic Gait velocity: decreased   General Gait Details: Pt needed cues to maintain L LE NWB.  Once reminded and reviewed pt able to adhere with gait.  Pt with increased pain with stand > sit,   Stairs            Wheelchair Mobility    Modified Rankin (Stroke Patients Only)       Balance Overall balance assessment: Needs assistance Sitting-balance support: Feet supported;No upper extremity supported Sitting balance-Leahy Scale: Good     Standing balance support: Bilateral upper extremity supported;During functional activity Standing balance-Leahy Scale: Fair Standing balance comment: Pt needed A for cleaning self after BM.                            Cognition Arousal/Alertness: Awake/alert Behavior During Therapy: WFL for tasks assessed/performed Overall Cognitive Status: Within Functional Limits for tasks assessed                                 General Comments: Pt focused on going to see his girlfriend      Exercises Total Joint Exercises Ankle Circles/Pumps: AROM;Both;20 reps    General Comments General comments (skin integrity, edema, etc.): Pt  education on WB status and reviewed.  Pt is very focused on seeing his girlfriend.  He had been requesting bath, but due to wanting to see girlfriend he declined bath that NT offered .  Did A pt with changing gown and cleaning underarms.      Pertinent Vitals/Pain Pain Assessment: Faces Faces Pain Scale: Hurts even more Pain Location: LUE/LE Pain Descriptors / Indicators: Aching;Throbbing Pain Intervention(s): Limited activity within patient's tolerance;Monitored during session;Repositioned    Home Living                      Prior  Function            PT Goals (current goals can now be found in the care plan section) Acute Rehab PT Goals PT Goal Formulation: With patient Time For Goal Achievement: 11/29/16 Potential to Achieve Goals: Good Progress towards PT goals: Progressing toward goals    Frequency    Min 5X/week      PT Plan Current plan remains appropriate    Co-evaluation             End of Session Equipment Utilized During Treatment: Gait belt Activity Tolerance: Patient tolerated treatment well Patient left: in chair;with call bell/phone within reach;Other (comment) (Pt left in 6N20 with girfriend.  Both nurses aware.) Nurse Communication: Mobility status PT Visit Diagnosis: Muscle weakness (generalized) (M62.81);Difficulty in walking, not elsewhere classified (R26.2);Pain Pain - Right/Left: Left Pain - part of body: Arm;Leg     Time: 1303 (no charge for time transferring pt to visit girlfriend)-1358 PT Time Calculation (min) (ACUTE ONLY): 55 min  Charges:  $Gait Training: 8-22 mins $Therapeutic Activity: 23-37 mins                    G Codes:       Brandon Holloway, [name] Pager [phone] 11/25/2016    Brandon Holloway 11/25/2016, 2:39 PM

## 2016-11-25 NOTE — Progress Notes (Signed)
Vitals:   11/25/16 1346 11/25/16 1941  BP: (!) 89/53 111/70  Pulse: (!) 138 (!) 107  Resp: (!) 22 20  Temp: 98.2 F (36.8 C) 99.6 F (37.6 C)   During the time these vitals were taken the patient was working with physical therapy. Patient has been orthostatic and had just stood up when the blood pressure was taken. HR and Resp were elevated as well due to pain. Please check vitals taken immediately after these.

## 2016-11-25 NOTE — Progress Notes (Signed)
Patient ID: Brandon Holloway, male   DOB: 27-Sep-1980, 36 y.o.   MRN: [id number] Patient is slow with physical therapy. He may require inpatient rehabilitation. Patient has no complaints this morning. Left lower extremities neurovascularly intact.

## 2016-11-25 NOTE — Progress Notes (Signed)
Tech explained to Pt that orthostatic B/P were needed. Pt focused on going to visit girlfriend and told tech that she was holding him up from going to see his queen.

## 2016-11-26 DIAGNOSIS — D62 Acute posthemorrhagic anemia: Secondary | ICD-10-CM

## 2016-11-26 DIAGNOSIS — G8918 Other acute postprocedural pain: Secondary | ICD-10-CM

## 2016-11-26 DIAGNOSIS — S52502A Unspecified fracture of the lower end of left radius, initial encounter for closed fracture: Secondary | ICD-10-CM

## 2016-11-26 DIAGNOSIS — I951 Orthostatic hypotension: Secondary | ICD-10-CM

## 2016-11-26 DIAGNOSIS — R739 Hyperglycemia, unspecified: Secondary | ICD-10-CM

## 2016-11-26 DIAGNOSIS — S7292XA Unspecified fracture of left femur, initial encounter for closed fracture: Secondary | ICD-10-CM
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
## 2016-11-26 NOTE — Progress Notes (Signed)
Inpatient Rehabilitation  Met with patient to discuss team's recommendation for IP Rehab.  Patient reports that his mom and brother will be helping him out at home after hopeful IP Rehab stay.  Shared booklets and answered questions.  Have initiated insurance authorization.  Plan to follow along for timing of medical readiness, bed availability, and insurance authorization.  Please call with questions.   Carmelia Roller., CCC/SLP Admission Coordinator  [place]  Cell [phone]

## 2016-11-26 NOTE — Progress Notes (Signed)
[facility] Progress Note  5 Days Post-Op  Subjective:  CC: L leg and arm pain, lightheadedness   Tolerating diet. No numbness or tingling. No acute events overnight. No fever or chills.  Objective: Vital signs in last 24 hours: Temp:  [98.2 F (36.8 C)-99.6 F (37.6 C)] 98.6 F (37 C) (04/24 0600) Pulse Rate:  [91-138] 91 (04/24 0600) Resp:  [18-22] 18 (04/24 0600) BP: (89-117)/(53-70) 117/64 (04/24 0600) SpO2:  [99 %-100 %] 100 % (04/24 0600) Last BM Date: 11/24/16  Intake/Output from previous day: 04/23 0701 - 04/24 0700 In: 240 [P.O.:240] Out: 1700 [Urine:1700] Intake/Output this shift: No intake/output data recorded.  PE: Gen:  Alert, NAD, pleasant, cooperative, well appearing Card:  Mild tachycardia, regular rhythm no M/G/R heard Pulm:  CTA, no W/R/R, effort normal Skin: no rashes noted, warm and dry Extremities: SCD's in place. LUE sensation intact and wiggles fingers, splint in place. LLE with moderate swelling of thigh with soft compartments and no TTP, 2+ DP pulses bilaterally, sensation intact.  Lab Results:   Recent Labs  11/25/16 1132  WBC 6.7  HGB 9.7*  HCT 29.6*  PLT 224   BMET No results for input(s): NA, K, CL, CO2, GLUCOSE, BUN, CREATININE, CALCIUM in the last 72 hours. PT/INR No results for input(s): LABPROT, INR in the last 72 hours. CMP     Component Value Date/Time   NA 137 11/22/2016 0654   K 4.2 11/22/2016 0654   CL 105 11/22/2016 0654   CO2 24 11/22/2016 0654   GLUCOSE 135 (H) 11/22/2016 0654   BUN 11 11/22/2016 0654   CREATININE 0.89 11/22/2016 0654   CALCIUM 8.3 (L) 11/22/2016 0654   GFRNONAA >60 11/22/2016 0654   GFRAA >60 11/22/2016 0654   Lipase  No results found for: LIPASE     Studies/Results: No results found.  Anti-infectives: Anti-infectives    Start     Dose/Rate Route Frequency Ordered Stop   11/22/16 0200  ceFAZolin (ANCEF) IVPB 1 g/50 mL premix     1 g 100 mL/hr over 30 Minutes  Intravenous Every 6 hours 11/21/16 2220 11/22/16 1516       Assessment/Plan [facility] ABL anemia - monitor CBC daily, pt orthostatic Left comminuted intraarticular distal radius fracture Closed comminuted left femur fracture s/p Procedure(s): INTRAMEDULLARY (IM) NAIL FEMORAL CLOSED REDUCTION WRIST  FEN: reg diet VTE: lovenox  Plan: CIR pending, pt agrees to CIR, ambulate, pain control    LOS: 4 days    Jerre Simon , [facility] 11/26/2016, 11:21 AM Pager: [phone] Consults: [phone] Mon-Fri 7:00 am-4:30 pm Sat-Sun 7:00 am-11:30 am
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
## 2016-11-26 NOTE — Progress Notes (Signed)
Pt sister-in-law gave pt a bath.

## 2016-11-26 NOTE — Progress Notes (Signed)
Tech offered Pt a bath. Pt stated that he had a bath last night. Tech then asked Pt was he ready to get a bath for today. Pt stated that he will get a bath later.

## 2016-11-27 ENCOUNTER — Encounter (HOSPITAL_COMMUNITY): Payer: Self-pay | Admitting: Physical Medicine and Rehabilitation

## 2016-11-27 ENCOUNTER — Inpatient Hospital Stay (HOSPITAL_COMMUNITY)
Admission: RE | Admit: 2016-11-27 | Discharge: 2016-12-04 | DRG: 092 | Disposition: A | Discharge status: Home Health | Payer: BLUE CROSS/BLUE SHIELD | Source: Intra-hospital | Attending: Physical Medicine & Rehabilitation | Admitting: Physical Medicine & Rehabilitation

## 2016-11-27 ENCOUNTER — Encounter: Payer: Self-pay | Admitting: Emergency Medicine

## 2016-11-27 ENCOUNTER — Inpatient Hospital Stay (HOSPITAL_COMMUNITY)
Admit: 2016-11-27 | Payer: BLUE CROSS/BLUE SHIELD | Source: Intra-hospital | Admitting: Physical Medicine & Rehabilitation

## 2016-11-27 DIAGNOSIS — S52612D Displaced fracture of left ulna styloid process, subsequent encounter for closed fracture with routine healing: Secondary | ICD-10-CM

## 2016-11-27 DIAGNOSIS — S7292XD Unspecified fracture of left femur, subsequent encounter for closed fracture with routine healing: Secondary | ICD-10-CM | POA: Diagnosis not present

## 2016-11-27 DIAGNOSIS — R739 Hyperglycemia, unspecified: Secondary | ICD-10-CM | POA: Diagnosis present

## 2016-11-27 DIAGNOSIS — M79609 Pain in unspecified limb: Secondary | ICD-10-CM | POA: Diagnosis not present

## 2016-11-27 DIAGNOSIS — D62 Acute posthemorrhagic anemia: Secondary | ICD-10-CM | POA: Diagnosis present

## 2016-11-27 DIAGNOSIS — S7292XS Unspecified fracture of left femur, sequela: Secondary | ICD-10-CM

## 2016-11-27 DIAGNOSIS — I951 Orthostatic hypotension: Secondary | ICD-10-CM | POA: Diagnosis present

## 2016-11-27 DIAGNOSIS — R42 Dizziness and giddiness: Secondary | ICD-10-CM | POA: Diagnosis present

## 2016-11-27 DIAGNOSIS — Z6828 Body mass index (BMI) 28.0-28.9, adult: Secondary | ICD-10-CM | POA: Diagnosis not present

## 2016-11-27 DIAGNOSIS — E46 Unspecified protein-calorie malnutrition: Secondary | ICD-10-CM | POA: Diagnosis not present

## 2016-11-27 DIAGNOSIS — E441 Mild protein-calorie malnutrition: Secondary | ICD-10-CM | POA: Diagnosis present

## 2016-11-27 DIAGNOSIS — R269 Unspecified abnormalities of gait and mobility: Principal | ICD-10-CM | POA: Diagnosis present

## 2016-11-27 DIAGNOSIS — S8992XA Unspecified injury of left lower leg, initial encounter: Secondary | ICD-10-CM

## 2016-11-27 DIAGNOSIS — R609 Edema, unspecified: Secondary | ICD-10-CM | POA: Diagnosis not present

## 2016-11-27 DIAGNOSIS — G8918 Other acute postprocedural pain: Secondary | ICD-10-CM | POA: Diagnosis not present

## 2016-11-27 DIAGNOSIS — F4323 Adjustment disorder with mixed anxiety and depressed mood: Secondary | ICD-10-CM | POA: Diagnosis present

## 2016-11-27 DIAGNOSIS — S52502S Unspecified fracture of the lower end of left radius, sequela: Secondary | ICD-10-CM

## 2016-11-27 MED ORDER — DOCUSATE SODIUM 100 MG PO CAPS: 100.0000 mg | ORAL_CAPSULE | Freq: Two times a day (BID) | ORAL | 0 refills | Status: DC

## 2016-11-27 MED ORDER — ACETAMINOPHEN 325 MG PO TABS
650.0000 mg | ORAL_TABLET | Freq: Four times a day (QID) | ORAL | Status: DC
  Administered 2016-11-27 – 2016-12-04 (×26): 650 mg via ORAL
  Filled 2016-11-27 (×27): qty 2

## 2016-11-27 MED ORDER — ONDANSETRON HCL 4 MG PO TABS: 4.0000 mg | ORAL_TABLET | Freq: Four times a day (QID) | ORAL | 0 refills | Status: DC | PRN

## 2016-11-27 MED ORDER — GUAIFENESIN-DM 100-10 MG/5ML PO SYRP: 5.0000 mL | ORAL_SOLUTION | Freq: Four times a day (QID) | ORAL | Status: DC | PRN

## 2016-11-27 MED ORDER — PROCHLORPERAZINE 25 MG RE SUPP: 12.5000 mg | Freq: Four times a day (QID) | RECTAL | Status: DC | PRN

## 2016-11-27 MED ORDER — ONDANSETRON HCL 4 MG/2ML IJ SOLN: 4.0000 mg | Freq: Four times a day (QID) | INTRAMUSCULAR | Status: DC | PRN

## 2016-11-27 MED ORDER — ENOXAPARIN SODIUM 40 MG/0.4ML ~~LOC~~ SOLN: 40.0000 mg | SUBCUTANEOUS | Status: DC

## 2016-11-27 MED ORDER — OXYCODONE HCL 5 MG PO TABS
5.0000 mg | ORAL_TABLET | ORAL | Status: DC | PRN
  Administered 2016-11-27: 5 mg via ORAL
  Administered 2016-11-28 – 2016-11-29 (×3): 10 mg via ORAL
  Filled 2016-11-27: qty 2
  Filled 2016-11-27: qty 1
  Filled 2016-11-27 (×2): qty 2

## 2016-11-27 MED ORDER — ACETAMINOPHEN 325 MG PO TABS: 650.0000 mg | ORAL_TABLET | Freq: Four times a day (QID) | ORAL | Status: DC

## 2016-11-27 MED ORDER — BOOST / RESOURCE BREEZE PO LIQD
1.0000 | Freq: Three times a day (TID) | ORAL | Status: DC
  Administered 2016-11-27 – 2016-11-29 (×4): 1 via ORAL

## 2016-11-27 MED ORDER — POLYETHYLENE GLYCOL 3350 17 G PO PACK: 17.0000 g | PACK | Freq: Every day | ORAL | Status: DC | PRN

## 2016-11-27 MED ORDER — ACETAMINOPHEN 325 MG PO TABS: 325.0000 mg | ORAL_TABLET | ORAL | Status: DC | PRN

## 2016-11-27 MED ORDER — DIPHENHYDRAMINE HCL 12.5 MG/5ML PO ELIX: 12.5000 mg | ORAL_SOLUTION | Freq: Four times a day (QID) | ORAL | Status: DC | PRN

## 2016-11-27 MED ORDER — FLEET ENEMA 7-19 GM/118ML RE ENEM: 1.0000 | ENEMA | Freq: Once | RECTAL | Status: DC | PRN

## 2016-11-27 MED ORDER — ALUM & MAG HYDROXIDE-SIMETH 200-200-20 MG/5ML PO SUSP: 30.0000 mL | ORAL | Status: DC | PRN

## 2016-11-27 MED ORDER — ONDANSETRON HCL 4 MG PO TABS: 4.0000 mg | ORAL_TABLET | Freq: Four times a day (QID) | ORAL | Status: DC | PRN

## 2016-11-27 MED ORDER — METHOCARBAMOL 500 MG PO TABS: 500.0000 mg | ORAL_TABLET | Freq: Four times a day (QID) | ORAL | Status: DC | PRN

## 2016-11-27 MED ORDER — BISACODYL 10 MG RE SUPP: 10.0000 mg | Freq: Every day | RECTAL | Status: DC | PRN

## 2016-11-27 MED ORDER — PROCHLORPERAZINE EDISYLATE 5 MG/ML IJ SOLN: 5.0000 mg | Freq: Four times a day (QID) | INTRAMUSCULAR | Status: DC | PRN

## 2016-11-27 MED ORDER — DOCUSATE SODIUM 100 MG PO CAPS
100.0000 mg | ORAL_CAPSULE | Freq: Two times a day (BID) | ORAL | Status: DC
  Administered 2016-11-27 – 2016-11-28 (×3): 100 mg via ORAL
  Filled 2016-11-27 (×6): qty 1

## 2016-11-27 MED ORDER — POLYETHYLENE GLYCOL 3350 17 G PO PACK
17.0000 g | PACK | Freq: Every day | ORAL | Status: DC | PRN
  Administered 2016-11-29: 17 g via ORAL
  Filled 2016-11-27 (×2): qty 1

## 2016-11-27 MED ORDER — TRAZODONE HCL 50 MG PO TABS
25.0000 mg | ORAL_TABLET | Freq: Every evening | ORAL | Status: DC | PRN
  Administered 2016-11-28: 50 mg via ORAL
  Filled 2016-11-27: qty 1

## 2016-11-27 MED ORDER — PROCHLORPERAZINE MALEATE 5 MG PO TABS: 5.0000 mg | ORAL_TABLET | Freq: Four times a day (QID) | ORAL | Status: DC | PRN

## 2016-11-27 MED ORDER — ENOXAPARIN SODIUM 40 MG/0.4ML ~~LOC~~ SOLN
40.0000 mg | SUBCUTANEOUS | Status: DC
Start: 2016-11-28 — End: 2016-12-04
  Administered 2016-11-28 – 2016-12-03 (×6): 40 mg via SUBCUTANEOUS
  Filled 2016-11-27 (×6): qty 0.4

## 2016-11-27 MED ORDER — OXYCODONE HCL 5 MG PO TABS: 5.0000 mg | ORAL_TABLET | ORAL | 0 refills | Status: DC | PRN

## 2016-11-27 NOTE — Progress Notes (Signed)
Rn called report to Angie, Charity fundraiser, at [employer]. She verbalized understanding of report. She stated OK to leave patient's IVs in place. Patient notified of transfer to CIR. Patient's belongings gathered up. Patient will go to CIR in patient bed. Angie, RN, stated that CAM walker did not have to go with patient. Copy of AVS being sent with patient to CIR.
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
## 2016-11-27 NOTE — Progress Notes (Signed)
Inpatient Rehabilitation  I have received insurance authorization, medical clearance, and have a bed available to admit patient to IP Rehab today.  Will proceed with admission and have updated team.  Please call with questions.   Charlane Ferretti., CCC/SLP Admission Coordinator  [facility] Inpatient Rehabilitation  Cell [phone]

## 2016-11-27 NOTE — Progress Notes (Signed)
Inpatient Rehabilitation  I am await on insurance authorization.  Plan to update team as soon as I know.  Please call with questions.   Charlane Ferretti., CCC/SLP Admission Coordinator  [facility] Inpatient Rehabilitation  Cell [phone]
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
## 2016-11-27 NOTE — Progress Notes (Signed)
Patient received from 5N at approximately 1655. Patient noted with left UE splint and ace wrap intact. Left LE with dressing intact hip. Patient oriented to room and call bell system. Patient verbalized understanding of admission process. Continue with plan of care.  Cleotilde Neer

## 2016-11-27 NOTE — Progress Notes (Signed)
Confirmed with Janalyn Rouse PA Trauma that patient medically stable for DC today. Spoke to patient to assess preference for DC planning IF CIR not approved by end of today. Patient would like to DC to SNF. Referral made to CSW, Elease Hashimoto to assess for SNF backup to CIR. Anticipate DC today.
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
## 2016-11-27 NOTE — Progress Notes (Signed)
OT Cancellation    11/27/16 1600  OT Visit Information  Last OT Received On 11/27/16  Assistance Needed +1  Reason Eval/Treat Not Completed Other (comment) (Pt with plans to dc to CIR today. Will defer OT needs to CIR. Thank you!)   Curlene Dolphin, OTR/L [phone]

## 2016-11-27 NOTE — Progress Notes (Signed)
[facility] Progress Note  6 Days Post-Op  Subjective:  CC: L leg and arm pain, lightheadedness   No numbness or tingling. No acute events overnight.  Objective: Vital signs in last 24 hours: Temp:  [98.2 F (36.8 C)-98.6 F (37 C)] 98.2 F (36.8 C) (04/25 0435) Pulse Rate:  [92-110] 92 (04/25 0435) Resp:  [16-20] 16 (04/25 0435) BP: (105-109)/(57-69) 105/69 (04/25 0435) SpO2:  [98 %-100 %] 100 % (04/25 0435) Last BM Date: 11/26/16  Intake/Output from previous day: 04/24 0701 - 04/25 0700 In: 480 [P.O.:480] Out: 600 [Urine:600] Intake/Output this shift: No intake/output data recorded.  PE: Gen: Alert, NAD, pleasant, cooperative, well appearing Card: RRR, no M/G/R heard Pulm: CTA, no W/R/R, effort normal Skin: no rashes noted, warm and dry Extremities: SCD's in place. LUE sensation intact and wiggles fingers, splint in place. LLE with moderate swelling of thigh with soft compartments and no TTP, 2+ PT pulses bilaterally, sensation intact.  Lab Results:   Recent Labs  11/25/16 1132  WBC 6.7  HGB 9.7*  HCT 29.6*  PLT 224   BMET No results for input(s): NA, K, CL, CO2, GLUCOSE, BUN, CREATININE, CALCIUM in the last 72 hours. PT/INR No results for input(s): LABPROT, INR in the last 72 hours. CMP     Component Value Date/Time   NA 137 11/22/2016 0654   K 4.2 11/22/2016 0654   CL 105 11/22/2016 0654   CO2 24 11/22/2016 0654   GLUCOSE 135 (H) 11/22/2016 0654   BUN 11 11/22/2016 0654   CREATININE 0.89 11/22/2016 0654   CALCIUM 8.3 (L) 11/22/2016 0654   GFRNONAA >60 11/22/2016 0654   GFRAA >60 11/22/2016 0654   Lipase  No results found for: LIPASE     Studies/Results: No results found.  Anti-infectives: Anti-infectives    Start     Dose/Rate Route Frequency Ordered Stop   11/22/16 0200  ceFAZolin (ANCEF) IVPB 1 g/50 mL premix     1 g 100 mL/hr over 30 Minutes Intravenous Every 6 hours 11/21/16 2220 11/22/16 1516        Assessment/Plan [facility] ABL anemia - monitor CBC daily, pt orthostatic Left comminuted intraarticular distal radius fracture Closed comminuted left femur fracture s/p Procedure(s): INTRAMEDULLARY (IM) NAIL FEMORAL CLOSED REDUCTION WRIST  FEN: reg diet VTE: lovenox  Plan: CIR placement pending, ambulate, pain control   LOS: 5 days    Jerre Simon , [facility] 11/27/2016, 9:12 AM Pager: [phone] Consults: [phone] Mon-Fri 7:00 am-4:30 pm Sat-Sun 7:00 am-11:30 am
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
## 2016-11-27 NOTE — Progress Notes (Signed)
LM with CIR admissions, Melissa, for updates on admission status.

## 2016-11-27 NOTE — Progress Notes (Signed)
PT Cancellation Note  Patient Details Name: Brandon Holloway MRN: [id number] DOB: [birth year]   Cancelled Treatment:    Reason Eval/Treat Not Completed: Other (comment) (pt with plans to d/c to CIR, will defer PT needs to CIR.  )   Mackenzye Mackel Artis Delay 11/27/2016, 3:50 PM Joycelyn Rua, PTA pager [phone]

## 2016-11-28 ENCOUNTER — Inpatient Hospital Stay (HOSPITAL_COMMUNITY): Payer: BLUE CROSS/BLUE SHIELD | Admitting: Physical Therapy

## 2016-11-28 ENCOUNTER — Inpatient Hospital Stay (HOSPITAL_COMMUNITY): Payer: BLUE CROSS/BLUE SHIELD

## 2016-11-28 ENCOUNTER — Inpatient Hospital Stay (HOSPITAL_COMMUNITY): Payer: BLUE CROSS/BLUE SHIELD | Admitting: Occupational Therapy

## 2016-11-28 DIAGNOSIS — R7309 Other abnormal glucose: Secondary | ICD-10-CM

## 2016-11-28 DIAGNOSIS — S8992XA Unspecified injury of left lower leg, initial encounter: Secondary | ICD-10-CM

## 2016-11-28 DIAGNOSIS — E8809 Other disorders of plasma-protein metabolism, not elsewhere classified: Secondary | ICD-10-CM

## 2016-11-28 DIAGNOSIS — S8990XA Unspecified injury of unspecified lower leg, initial encounter: Secondary | ICD-10-CM

## 2016-11-28 DIAGNOSIS — S52502S Unspecified fracture of the lower end of left radius, sequela: Secondary | ICD-10-CM

## 2016-11-28 DIAGNOSIS — E46 Unspecified protein-calorie malnutrition: Secondary | ICD-10-CM

## 2016-11-28 DIAGNOSIS — S99919A Unspecified injury of unspecified ankle, initial encounter: Secondary | ICD-10-CM

## 2016-11-28 DIAGNOSIS — S99929A Unspecified injury of unspecified foot, initial encounter: Secondary | ICD-10-CM

## 2016-11-28 DIAGNOSIS — D62 Acute posthemorrhagic anemia: Secondary | ICD-10-CM

## 2016-11-28 DIAGNOSIS — R609 Edema, unspecified: Secondary | ICD-10-CM

## 2016-11-28 DIAGNOSIS — M79609 Pain in unspecified limb: Secondary | ICD-10-CM

## 2016-11-28 DIAGNOSIS — R42 Dizziness and giddiness: Secondary | ICD-10-CM

## 2016-11-28 DIAGNOSIS — R269 Unspecified abnormalities of gait and mobility: Secondary | ICD-10-CM

## 2016-11-28 DIAGNOSIS — R739 Hyperglycemia, unspecified: Secondary | ICD-10-CM

## 2016-11-28 LAB — COMPREHENSIVE METABOLIC PANEL
ALBUMIN: 2.8 g/dL — AB (ref 3.5–5.0)
ALK PHOS: 68 U/L (ref 38–126)
ALT: 38 U/L (ref 17–63)
AST: 43 U/L — ABNORMAL HIGH (ref 15–41)
Anion gap: 8 (ref 5–15)
BILIRUBIN TOTAL: 0.8 mg/dL (ref 0.3–1.2)
BUN: 14 mg/dL (ref 6–20)
CALCIUM: 8.7 mg/dL — AB (ref 8.9–10.3)
CO2: 27 mmol/L (ref 22–32)
CREATININE: 0.92 mg/dL (ref 0.61–1.24)
Chloride: 101 mmol/L (ref 101–111)
GFR calc Af Amer: >60 mL/min (ref >60)
GFR calc non Af Amer: >60 mL/min (ref >60)
Glucose, Bld: 115 mg/dL — ABNORMAL HIGH (ref 65–99)
Potassium: 4.3 mmol/L (ref 3.5–5.1)
SODIUM: 136 mmol/L (ref 135–145)
TOTAL PROTEIN: 6.1 g/dL — AB (ref 6.5–8.1)

## 2016-11-28 LAB — CBC WITH DIFFERENTIAL/PLATELET
BASOS PCT: 0 %
Basophils Absolute: 0 10*3/uL (ref 0.0–0.1)
EOS ABS: 0.2 10*3/uL (ref 0.0–0.7)
Eosinophils Relative: 3 %
HEMATOCRIT: 28.7 % — AB (ref 39.0–52.0)
HEMOGLOBIN: 9.5 g/dL — AB (ref 13.0–17.0)
LYMPHS ABS: 1.3 10*3/uL (ref 0.7–4.0)
Lymphocytes Relative: 17 %
MCH: 30.9 pg (ref 26.0–34.0)
MCHC: 33.1 g/dL (ref 30.0–36.0)
MCV: 93.5 fL (ref 78.0–100.0)
Monocytes Absolute: 0.4 10*3/uL (ref 0.1–1.0)
Monocytes Relative: 5 %
NEUTROS ABS: 5.8 10*3/uL (ref 1.7–7.7)
NEUTROS PCT: 75 %
Platelets: 271 10*3/uL (ref 150–400)
RBC: 3.07 MIL/uL — AB (ref 4.22–5.81)
RDW: 11.9 % (ref 11.5–15.5)
WBC: 7.6 10*3/uL (ref 4.0–10.5)

## 2016-11-28 MED ORDER — PRO-STAT SUGAR FREE PO LIQD
30.0000 mL | Freq: Two times a day (BID) | ORAL | Status: DC
  Administered 2016-11-28 – 2016-11-29 (×3): 30 mL via ORAL
  Filled 2016-11-28 (×2): qty 30

## 2016-11-28 MED ORDER — TRAMADOL HCL 50 MG PO TABS: 50.0000 mg | ORAL_TABLET | Freq: Four times a day (QID) | ORAL | Status: DC | PRN

## 2016-11-28 NOTE — Progress Notes (Signed)
[place] PHYSICAL MEDICINE & REHABILITATION     PROGRESS NOTE  Subjective/Complaints:  Pt seen laying in bed this AM.  He states he had difficulty getting comfortable overnight, but is okay with starting therapies.   ROS: Denies CP, SOB, N/V/D.  Objective: Vital Signs: Blood pressure (!) 104/59, pulse 97, temperature 98.2 F (36.8 C), temperature source Oral, resp. rate 17, height  (1.803 m), weight 92.2 kg (203 lb 3.2 oz), SpO2 100 %. No results found.  Recent Labs  11/25/16 1132 11/28/16 0623  WBC 6.7 7.6  HGB 9.7* 9.5*  HCT 29.6* 28.7*  PLT 224 271    Recent Labs  11/28/16 0623  NA 136  K 4.3  CL 101  GLUCOSE 115*  BUN 14  CREATININE 0.92  CALCIUM 8.7*   CBG (last 3)  No results for input(s): GLUCAP in the last 72 hours.  Wt Readings from Last 3 Encounters:  11/27/16 92.2 kg (203 lb 3.2 oz)  11/21/16 81.6 kg (180 lb)  11/04/16 86.6 kg (191 lb)    Physical Exam:  BP (!) 104/59 (BP Location: Right Arm)   Pulse 97   Temp 98.2 F (36.8 C) (Oral)   Resp 17   Ht  (1.803 m)   Wt 92.2 kg (203 lb 3.2 oz)   SpO2 100%   BMI 28.34 kg/m  Constitutional: He appears well-developed and well-nourished. NAD. HENT: Normocephalic and atraumatic.  Eyes: EOMI. No Discharge. Cardiovascular: Normal rate and regular rhythm.  No JVD. Respiratory: Effort normal and breath sounds normal.  GI: Soft. Bowel sounds are normal.  Musculoskeletal: He exhibits edema and tenderness.  Neurological: He is alert and oriented. Motor: RUE/RLE 5/5 proximal to distal LUE: 5/5 shoulder abduction, elbow/wrist braced, hand grip 4-/5 LLE: HF, KE 2/5, ADF/PF 4/5 (pain inhibition)  Skin: Skin is warm and dry.  LUE braced LLE with dressing c/d/i  Psychiatric: He has a normal mood and affect. His behavior is normal.    Assessment/Plan: 1. Functional deficits secondary to multi-ortho which require 3+ hours per day of interdisciplinary therapy in a comprehensive inpatient rehab  setting. Physiatrist is providing close team supervision and 24 hour management of active medical problems listed below. Physiatrist and rehab team continue to assess barriers to discharge/monitor patient progress toward functional and medical goals.  Function:  Bathing Bathing position      Bathing parts      Bathing assist        Upper Body Dressing/Undressing Upper body dressing                    Upper body assist        Lower Body Dressing/Undressing Lower body dressing                                  Lower body assist        Toileting Toileting          Toileting assist     Transfers Chair/bed transfer             Locomotion Ambulation           Wheelchair          Cognition Comprehension    Expression    Social Interaction    Problem Solving    Memory      Medical Problem List and Plan: 1.  Gait disorder and limitation in self-care secondary  to multi-ortho  Begin CIR 2.  DVT Prophylaxis/Anticoagulation: Pharmaceutical: Lovenox 3. Pain Management: continue tylenol qid with oxycodone and robaxin prn.  4. Mood: LCSWS to follow for evaluation and support.  5. Neuropsych: This patient is capable of making decisions on his own behalf. 6. Skin/Wound Care: Will monitor incision for healing.  7. Fluids/Electrolytes/Nutrition: Monitor I/Os  BMP within acceptable range on 4/26 8. ABLA: Added iron supplement with supper.   Hb 9.5 on 4/26  Cont to monitor 9. Dizziness: Has had hypotension  Orthostatic vitals pending.   Encourage patient to increase fluid intake.  10. Hyperglycemia  Likely stress induced  Cont to monitor 11. Hypoalbuminemia  Supplement initiated 4/26  LOS (Days) 1 A FACE TO FACE EVALUATION WAS PERFORMED  Ankit Karis Juba 11/28/2016 8:50 AM
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
## 2016-11-28 NOTE — Progress Notes (Signed)
Patient information reviewed and entered into eRehab system by Janes Colegrove, RN, CRRN, PPS Coordinator.  Information including medical coding and functional independence measure will be reviewed and updated through discharge.    
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
## 2016-11-28 NOTE — Progress Notes (Signed)
VASCULAR LAB PRELIMINARY  PRELIMINARY  PRELIMINARY  PRELIMINARY  Bilateral lower extremity venous duplex completed.    Preliminary report:  Bilateral:  No evidence of DVT, superficial thrombosis, or Baker's Cyst.  Leith Szafranski, RVS 11/28/2016, 3:27 PM

## 2016-11-28 NOTE — Progress Notes (Signed)
Physical Therapy Session Note  Patient Details  Name: Brandon Holloway MRN: [id number] Date of Birth: [birth year]  Today's Date: 11/28/2016 PT Individual Time: 1300-1400 PT Individual Time Calculation (min): 60 min   Short Term Goals: Week 1:  PT Short Term Goal 1 (Week 1): Pt to perform supine<>sitting with supervision PT Short Term Goal 2 (Week 1): Pt to perform sitting balance independently X 5 min PT Short Term Goal 3 (Week 1): Pt to ambulate 50 ft with platform rw.  PT Short Term Goal 4 (Week 1): Pt to perform sit<>stand transfers with supervision.   Skilled Therapeutic Interventions/Progress Updates: Pt in bed upon arrival and agreeable to PT session. Mother "[name]" present. Discussed D/C options and mother reports that she could go over to the patient's house intermittently or pt could stay with her if more assistance is needed. Patient's mother does assist with taking care of patient's 21 mo old son. Bed Mobility: Rolling performed bilaterally with min assist for donning shorts with max assist.  Min assist needed with LLE for supine<>sitting, pt assisting by hooking Lt LE with Rt. Pt unable to control LE all the way to the floor. Transfers: min guard assist with standing and min assist with LLE due to pain with knee flexion. Improving after working on seated flexion but remains painful. Ambulation performed 1X 25 ft, 2X 10 ft - using Lt UE platform walker, pt consistent with TDWB status through LLE. Able to go up/down 1 3 inch step with min assist. W/c mobility performed X 70 ft with min assist initially and improving to supervision by end of session. Pt using Rt UE/LE for propulsion. Working on seated knee flexion at EOB and in w/c as tolerated. Pt returned to room after session, sitting in w/c with all needs in reach. Ice provided for Lt thigh.  Therapy Documentation Precautions:  Precautions Precautions: Fall Restrictions Weight Bearing Restrictions: Yes LUE Weight Bearing: Non weight  bearing LLE Weight Bearing: Touchdown weight bearing  Pain: Reports pain as moderate in Lt thigh with activity. Reports having pain meds recently and ice provided.   See Function Navigator for Current Functional Status.   Therapy/Group: Individual Therapy  Delton See, PT 11/28/2016, 3:49 PM

## 2016-11-29 ENCOUNTER — Inpatient Hospital Stay (HOSPITAL_COMMUNITY): Payer: BLUE CROSS/BLUE SHIELD | Admitting: Occupational Therapy

## 2016-11-29 ENCOUNTER — Inpatient Hospital Stay (HOSPITAL_COMMUNITY): Payer: BLUE CROSS/BLUE SHIELD | Admitting: Physical Therapy

## 2016-11-29 DIAGNOSIS — G8918 Other acute postprocedural pain: Secondary | ICD-10-CM

## 2016-11-29 LAB — URINE CULTURE

## 2016-11-29 LAB — HEMOGLOBIN A1C
Hgb A1c MFr Bld: 5 % (ref 4.8–5.6)
Mean Plasma Glucose: 97 mg/dL

## 2016-11-29 MED ORDER — METHOCARBAMOL 750 MG PO TABS
750.0000 mg | ORAL_TABLET | Freq: Four times a day (QID) | ORAL | Status: DC
  Administered 2016-11-29 – 2016-11-30 (×6): 750 mg via ORAL
  Filled 2016-11-29 (×10): qty 1

## 2016-11-29 MED ORDER — BOOST / RESOURCE BREEZE PO LIQD
1.0000 | Freq: Every day | ORAL | Status: DC
  Administered 2016-11-30 – 2016-12-01 (×2): 1 via ORAL

## 2016-11-29 MED ORDER — TEMAZEPAM 7.5 MG PO CAPS
7.5000 mg | ORAL_CAPSULE | Freq: Every evening | ORAL | Status: DC | PRN
  Administered 2016-11-29: 7.5 mg via ORAL
  Filled 2016-11-29: qty 1

## 2016-11-29 MED ORDER — TRAMADOL HCL 50 MG PO TABS
50.0000 mg | ORAL_TABLET | Freq: Three times a day (TID) | ORAL | Status: DC
Start: 2016-11-29 — End: 2016-12-01
  Administered 2016-11-29 – 2016-11-30 (×6): 50 mg via ORAL
  Filled 2016-11-29 (×7): qty 1

## 2016-11-29 MED ORDER — DOCUSATE SODIUM 100 MG PO CAPS
200.0000 mg | ORAL_CAPSULE | Freq: Every day | ORAL | Status: DC
  Administered 2016-11-29 – 2016-11-30 (×2): 200 mg via ORAL
  Filled 2016-11-29 (×3): qty 2

## 2016-11-29 MED ORDER — ENSURE ENLIVE PO LIQD
237.0000 mL | Freq: Two times a day (BID) | ORAL | Status: DC
  Administered 2016-11-29 – 2016-12-04 (×9): 237 mL via ORAL

## 2016-11-29 NOTE — Progress Notes (Signed)
Orthopedic Tech Progress Note Patient Details:  Brandon Holloway [birth year] [id number]  Ortho Devices Type of Ortho Device: Abdominal binder Ortho Device/Splint Location: Nurse requested Large Abdominal binder.  Supplied Large Abdominal for pt at bedside. Nurse informed.    Alvina Chou 11/29/2016, 3:44 PM
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
## 2016-11-29 NOTE — Progress Notes (Signed)
Physical Therapy Session Note  Patient Details  Name: Brandon Holloway MRN: [id number] Date of Birth: 11/10/1980  Today's Date: 11/29/2016 PT Individual Time: 1001-1056 PT Individual Time Calculation (min): 55 min   Short Term Goals: Week 1:  PT Short Term Goal 1 (Week 1): Pt to perform supine<>sitting with supervision PT Short Term Goal 2 (Week 1): Pt to perform sitting balance independently X 5 min PT Short Term Goal 3 (Week 1): Pt to ambulate 50 ft with platform rw.  PT Short Term Goal 4 (Week 1): Pt to perform sit<>stand transfers with supervision.   Skilled Therapeutic Interventions/Progress Updates:    Session 1: Pt in bed upon arrival, reports that he has been feeling dizzy. EMR indicates pt becoming severely orthostatic shortly before PT session. Session modified to focus on bed level to sitting activity as tolerated. Exercises: LLE; ankle pumps, QS, Heel slides, hip abd/add, glute sets. Progressing to sitting as pt reports feeling better, supervision level with pt hooking LLE. Static sitting performed with UE support. Adding Lt knee flexion stretch as tolerated. Pt reports starting to feel mild increase in dizziness and returned to supine (supervision). Working on scooting on bed and using Rt UE/LE to assist. Pt in bed with all needs in reach after session. Reports feeling better lying down.  Session 2: Pt in bed upon arrival, reports feeling a little better. BP113/62 sitting. Working on slow transition to sitting EOB. BP dropping to 72/46 without reports of symptoms. Sitting EOB with pt continuing to deny any increased dizziness. Pt requesting attempt to use BSC. Stand-pivot transfers performed using Lt platform rw and min assist. Pt able to void while sitting but unable to have BM. Returned to bed via stand-pivot and returned to supine with supervision. Pt hooking LLE with Rt. Able to scoot laterally with Rt LE bridge and Rt UE assist. Pt states he had increased dizziness with transition to  supine, improving over next 2-3 minutes. Pt in bed with all needs in reach following session.  Therapy Documentation Precautions:  Precautions Precautions: Fall Restrictions Weight Bearing Restrictions: Yes LUE Weight Bearing: Non weight bearing LLE Weight Bearing: Touchdown weight bearing Pain: 4/10 in Lt wrist and thigh.Nursing giving pain meds during session.   See Function Navigator for Current Functional Status.   Therapy/Group: Individual Therapy  Delton See, PT 11/29/2016, 12:27 PM

## 2016-11-29 NOTE — Progress Notes (Signed)
Got severely orthostatic during ADLs with SBP dropping to 16'X. He has been unable to sleep due to thigh pain but does not want to ask for meds--"I don't want to ask for it". Received trazodone last pm with resultant worsening of orthostatic symptoms. Encouraged patient again on importance of intake. Will order abdominal binder for support. Change trazodone to Restoril. Will schedule robaxin and tramadol qid for pain management. Advised patient that he needs to advocate for himself. Will check lytes Monday.  Question post concussive/need ST for higher level cognitive tasks.

## 2016-11-29 NOTE — Progress Notes (Signed)
Occupational Therapy Session Note  Patient Details  Name: Brandon Holloway MRN: [id number] Date of Birth: [birth year]  Today's Date: 11/29/2016 OT Individual Time: 1450-1538 OT Individual Time Calculation (min): 48 min    Short Term Goals: Week 1:  OT Short Term Goal 1 (Week 1): Pt will be able to use PFRW to take 5 steps to enter bathroom with close S.  OT Short Term Goal 2 (Week 1): Pt will be able to don pants with set up. OT Short Term Goal 3 (Week 1): pt will be able to don shirt with set up.  OT Short Term Goal 4 (Week 1): Pt will be able to bathe with AE with set up.  Skilled Therapeutic Interventions/Progress Updates:    Pt transferred from the bed to wheelchair with min assist squat/stand pivot without assistive device.  He was able to use the reacher to practice doffing his gripper sock on the left foot.  Min assist for donning it using the sockaide.  He was also able to donn the left shoe with setup as well without untying it.  BP taken in sitting with abdominal binder on at 98/64 and 100/54.  No reports of dizziness.  Completed toilet transfer with min assist using the RW with platform on the left to elevated toilet with rail.  Min assist for managing clothing after completion of toileting task.  Finished session with pt in wheelchair at bedside with call button and phone in reach.    Therapy Documentation Precautions:  Precautions Precautions: Fall Restrictions Weight Bearing Restrictions: Yes LUE Weight Bearing: Non weight bearing LLE Weight Bearing: Touchdown weight bearing  Vital Signs: Therapy Vitals Temp: 98.1 F (36.7 C) Temp Source: Oral Pulse Rate: (!) 109 Resp: 18 BP: 117/62 Patient Position (if appropriate): Lying Oxygen Therapy SpO2: 99 % O2 Device: Not Delivered Pain: Pain Assessment Pain Assessment: 0-10 Pain Score: 5  Pain Type: Acute pain Pain Location: Leg Pain Orientation: Left Pain Descriptors / Indicators: Discomfort Pain Onset:  On-going Patients Stated Pain Goal: 2 Pain Intervention(s): Repositioned Multiple Pain Sites: No ADL: See Function Navigator for Current Functional Status.   Therapy/Group: Individual Therapy  Emmaly Leech OTR/L 11/29/2016, 3:48 PM

## 2016-11-29 NOTE — Progress Notes (Signed)
Occupational Therapy Session Note  Patient Details  Name: Brandon Holloway MRN: [id number] Date of Birth: 04-16-1981  Today's Date: 11/29/2016 OT Individual Time: 0800-0920 OT Individual Time Calculation (min): 80 min    Short Term Goals: Week 1:  OT Short Term Goal 1 (Week 1): Pt will be able to use PFRW to take 5 steps to enter bathroom with close S.  OT Short Term Goal 2 (Week 1): Pt will be able to don pants with set up. OT Short Term Goal 3 (Week 1): pt will be able to don shirt with set up.  OT Short Term Goal 4 (Week 1): Pt will be able to bathe with AE with set up.  Skilled Therapeutic Interventions/Progress Updates:    Pt seen for OT treatment during session.  Supine to sit EOB with min assist.  He was able to cross the RLE under the left leg for advancing the LLE off of the bed.  He was able to complete transition to sitting with min assist using the grab bar for support on the right side.  Completed transfer to the wheelchair stand pivot with min assist to the right side.  Min assist for UB and LB bathing sit to stand.  Decreased ability to reach the LLE for washing the right foot.  Mod assist for threading pants and gripper sock over the LLE secondary to decreased pain and decreased flexibility.  Pt with increased dizziness noted in sitting per pt report at end of session.  BP taken in sitting at 78/48.  Pt returned to bed with min assist and BP taken again in supine with increase 114/65.  PA made aware of situation and pt left in bed with call button and phone in reach.    Therapy Documentation Precautions:  Precautions Precautions: Fall Restrictions Weight Bearing Restrictions: Yes LUE Weight Bearing: Non weight bearing LLE Weight Bearing: Touchdown weight bearing  Vital Signs: Therapy Vitals Pulse Rate: (!) 114 Patient Position (if appropriate): Sitting Pain: Pain Assessment Pain Assessment: 0-10 Pain Score: 5  Pain Type: Acute pain Pain Location: Leg Pain  Orientation: Left Pain Descriptors / Indicators: Aching Pain Onset: On-going Patients Stated Pain Goal: 2 Pain Intervention(s): Repositioned Multiple Pain Sites: No ADL: ADL ADL Comments: refer to functional navigator  See Function Navigator for Current Functional Status.   Therapy/Group: Individual Therapy  Tyger Wichman OTR/L 11/29/2016, 12:15 PM
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
## 2016-11-30 ENCOUNTER — Inpatient Hospital Stay (HOSPITAL_COMMUNITY): Payer: BLUE CROSS/BLUE SHIELD | Admitting: Occupational Therapy

## 2016-11-30 ENCOUNTER — Inpatient Hospital Stay (HOSPITAL_COMMUNITY): Payer: BLUE CROSS/BLUE SHIELD | Admitting: Physical Therapy

## 2016-11-30 DIAGNOSIS — I951 Orthostatic hypotension: Secondary | ICD-10-CM

## 2016-11-30 MED ORDER — SODIUM CHLORIDE 0.9 % IV SOLN
INTRAVENOUS | Status: DC
  Filled 2016-11-30: qty 1000

## 2016-11-30 NOTE — Progress Notes (Signed)
Occupational Therapy Session Note  Patient Details  Name: Brandon Holloway MRN: [id number] Date of Birth: Jul 08, 1981  Today's Date: 11/30/2016 OT Individual Time: 1300-1420 OT Individual Time Calculation (min): 80 min    Short Term Goals: Week 1:  OT Short Term Goal 1 (Week 1): Pt will be able to use PFRW to take 5 steps to enter bathroom with close S.  OT Short Term Goal 2 (Week 1): Pt will be able to don pants with set up. OT Short Term Goal 3 (Week 1): pt will be able to don shirt with set up.  OT Short Term Goal 4 (Week 1): Pt will be able to bathe with AE with set up.  Skilled Therapeutic Interventions/Progress Updates:    Pt seen for OT treatment, completed transition to sitting from supine with supervision using bed rail.  He was able to donn his shoe in sitting on the EOB with setup and then therapist assisted with donning abdominal binder.  He then transferred to the right squat pivot with min assist, no assistive device to the wheelchair.  Once in the chair he was able to roll himself down to the ADL apartment where session focused on transfers to the bed and to the elevated toilet.  Min assist for transfer into the bed on the right side and left.  Discussed the need to stay on the right side of the bed as it is easier to get in and out secondary to weightbearing restrictions in the left arm.  Pt utilized leg lifter for assisting with lifting the LLE into bed and from bed to the floor with min assist.  Still with increased pain noted in the LLE. Completed toilet transfer with min assist as well, ambulating from the EOB to the toilet with use of the RW with platform.  Pt completed all aspects of toileting sit to stand with min guard assist as well.  Finished session with pt back in room with call button and phone in reach and pt up in wheelchair finishing lunch.        Therapy Documentation Precautions:  Precautions Precautions: Fall Restrictions Weight Bearing Restrictions: Yes LUE  Weight Bearing: Non weight bearing (can weightbear on platform RW) LLE Weight Bearing: Touchdown weight bearing  Vital Signs: Therapy Vitals Temp: 98.6 F (37 C) Temp Source: Oral Pulse Rate: (!) 101 Resp: 18 BP: 110/68 Patient Position (if appropriate): Sitting Oxygen Therapy SpO2: 100 % O2 Device: Not Delivered   Pain: Pain Assessment Pain Assessment: Faces Pain Score: 3  Faces Pain Scale: Hurts little more Pain Type: Acute pain Pain Location: Leg Pain Orientation: Left Pain Descriptors / Indicators: Aching Pain Frequency: Constant Pain Onset: With Activity Pain Intervention(s): Repositioned ADL: See Function Navigator for Current Functional Status.   Therapy/Group: Individual Therapy  Lehua Flores OTR/L 11/30/2016, 3:53 PM

## 2016-11-30 NOTE — Progress Notes (Signed)
Patient got up without assistance twice during shift. Oncpatient called bell in bathroom but was already standing when RN arrived to assist. Second time patient reported he had called to get in bed and waited 30 minutes, then put himself in bed. Educated patient about need to have supervision for transfers. Encouraged him to call a second time if assistance had not come in timely manner. Will continue to monitor.

## 2016-11-30 NOTE — Progress Notes (Signed)
Physical Therapy Session Note  Patient Details  Name: Brandon Holloway MRN: [id number] Date of Birth: 1980-11-11  Today's Date: 11/30/2016 PT Individual Time: 1000-1100 PT Individual Time Calculation (min): 60 min   Short Term Goals: Week 1:  PT Short Term Goal 1 (Week 1): Pt to perform supine<>sitting with supervision PT Short Term Goal 2 (Week 1): Pt to perform sitting balance independently X 5 min PT Short Term Goal 3 (Week 1): Pt to ambulate 50 ft with platform rw.  PT Short Term Goal 4 (Week 1): Pt to perform sit<>stand transfers with supervision.   Skilled Therapeutic Interventions/Progress Updates:  Pt was seen bedside in the am. Md present at beginning of treatment. MD to place order for TED hose due to BP issues. Donned thigh TED hose and binder. Pt transferred supine to edge of bed with side rail, head of bed elevated and S. Pt transferred sit to stand with L platform walker and min guard with verbal cues. Orthostatic vitals with TED hose in place as follows: supine 117/59, 94, edge of bed 121/68, 115, standing 131/61 HR 132. Pt propelled w/c about 125 feet x 2 with R LE and S. In gym treatment focused on L LE strengthening and ROM, 3 sets x 10 reps each hip and 1 set x 10 reps each LAQs. Pt returned to room and left sitting up in w/c with call bell within reach.   Therapy Documentation Precautions:  Precautions Precautions: Fall Restrictions Weight Bearing Restrictions: Yes LUE Weight Bearing: Non weight bearing (can weightbear on platform RW) LLE Weight Bearing: Touchdown weight bearing General:   Pain: Pt c/o pain L UE and LE.   See Function Navigator for Current Functional Status.   Therapy/Group: Individual Therapy  [name] 11/30/2016, 12:51 PM

## 2016-11-30 NOTE — Progress Notes (Addendum)
[facility] Individual Statement of Services  Patient Name:  Brandon Holloway  Date:  11/30/2016  Welcome [facility] the [facility].  Our goal is to provide you with an individualized program based on your diagnosis and situation, designed to meet your specific needs.  With this comprehensive rehabilitation program, you will be expected to participate in at least 3 hours of rehabilitation therapies Monday-Friday, with modified therapy programming on the weekends.  Your rehabilitation program will include the following services:  Physical Therapy (PT), Occupational Therapy (OT), 24 hour per day rehabilitation nursing, Neuropsychology, Case Management (Social Worker), Rehabilitation Medicine, Nutrition Services and Pharmacy Services  Weekly team conferences will be held on Wednesdays to discuss your progress.  Your Social Worker will talk with you frequently to get your input and to update you on team discussions.  Team conferences with you and your family in attendance may also be held.  Expected length of stay:  10 to 14 days  Overall anticipated outcome:  Modified Independent; Supervision for car transfers and shower transfers; minimal assistance for stairs  Depending on your progress and recovery, your program may change. Your Social Worker will coordinate services and will keep you informed of any changes. Your Social Worker's name and contact numbers are listed  below.  The following services may also be recommended but are not provided by the [facility]:   Driving Evaluations  Home Health Rehabiltiation Services  Outpatient Rehabilitation Services  Vocational Rehabilitation   Arrangements will be made to provide these services after discharge if needed.  Arrangements include referral to agencies that provide these services.  Your insurance has been verified to be:  [employer] Your primary doctor is:  You do not have one  yet.  I will work on this with you.  Pertinent information will be shared with your doctor and your insurance company.  Social Worker:  Staci Acosta, LCSW  [phone] or [phone]  Information discussed with and copy given to patient by: Elvera Lennox, 11/30/2016, 3:48 PM

## 2016-11-30 NOTE — Progress Notes (Addendum)
[facility] PHYSICAL MEDICINE & REHABILITATION     PROGRESS NOTE  Subjective/Complaints:  Dizziness with sitting up. Repeat orthostatic vitals per physical therapy were normal Reviewed labs. Denies chest pain or shortness of breath.  ROS: Denies CP, SOB, N/V/D.  Objective: Vital Signs: Blood pressure 109/64, pulse 99, temperature 98.9 F (37.2 C), temperature source Oral, resp. rate 18, height  (1.803 m), weight 92.2 kg (203 lb 3.2 oz), SpO2 100 %. No results found.  Recent Labs  11/28/16 0623  WBC 7.6  HGB 9.5*  HCT 28.7*  PLT 271    Recent Labs  11/28/16 0623  NA 136  K 4.3  CL 101  GLUCOSE 115*  BUN 14  CREATININE 0.92  CALCIUM 8.7*   CBG (last 3)  No results for input(s): GLUCAP in the last 72 hours.  Wt Readings from Last 3 Encounters:  11/27/16 92.2 kg (203 lb 3.2 oz)  11/21/16 81.6 kg (180 lb)  11/04/16 86.6 kg (191 lb)    Physical Exam:  BP 109/64 (BP Location: Right Arm)   Pulse 99   Temp 98.9 F (37.2 C) (Oral)   Resp 18   Ht  (1.803 m)   Wt 92.2 kg (203 lb 3.2 oz)   SpO2 100%   BMI 28.34 kg/m  Constitutional: He appears well-developed and well-nourished. NAD. HENT: Normocephalic and atraumatic.  Eyes: EOMI. No Discharge. Cardiovascular: RRR. No JVD. Respiratory: Effort normal and breath sounds normal.  GI: Soft. Bowel sounds are normal.  Musculoskeletal: He exhibits edema and tenderness.  Neurological: He is alert and oriented. Motor: RUE/RLE 5/5 proximal to distal LUE: 5/5 shoulder abduction, elbow/wrist braced, hand grip 4-/5 LLE: HF, KE 2/5, ADF/PF 4/5 (unchanged, pain inhibition)  Skin: Skin is warm and dry. No evidence of tenting  LUE braced LLE with dressing c/d/i  Psychiatric: He has a normal mood and affect. His behavior is normal.    Assessment/Plan: 1. Functional deficits secondary to multi-ortho which require 3+ hours per day of interdisciplinary therapy in a comprehensive inpatient rehab setting. Physiatrist is  providing close team supervision and 24 hour management of active medical problems listed below. Physiatrist and rehab team continue to assess barriers to discharge/monitor patient progress toward functional and medical goals.  Function:  Bathing Bathing position   Position: Wheelchair/chair at sink  Bathing parts Body parts bathed by patient: Left arm, Chest, Abdomen, Front perineal area, Buttocks, Right upper leg, Left upper leg Body parts bathed by helper: Right lower leg, Left lower leg, Back  Bathing assist        Upper Body Dressing/Undressing Upper body dressing   What is the patient wearing?: Pull over shirt/dress     Pull over shirt/dress - Perfomed by patient: Thread/unthread right sleeve, Thread/unthread left sleeve, Put head through opening, Pull shirt over trunk          Upper body assist Assist Level: Supervision or verbal cues      Lower Body Dressing/Undressing Lower body dressing   What is the patient wearing?: Non-skid slipper socks, Shoes, Pants     Pants- Performed by patient: Thread/unthread right pants leg, Pull pants up/down Pants- Performed by helper: Thread/unthread left pants leg Non-skid slipper socks- Performed by patient: Don/doff right sock Non-skid slipper socks- Performed by helper: Don/doff left sock     Shoes - Performed by patient: Don/doff right shoe            Lower body assist        Toileting Toileting  Toileting steps completed by patient: Adjust clothing prior to toileting, Performs perineal hygiene Toileting steps completed by helper: Adjust clothing after toileting    Toileting assist Assist level: Touching or steadying assistance (Pt.75%)   Transfers Chair/bed transfer   Chair/bed transfer method: Stand pivot Chair/bed transfer assist level: Touching or steadying assistance (Pt > 75%) Chair/bed transfer assistive device: Armrests, Patent attorney     Max distance: 25 ft Assist level: Touching  or steadying assistance (Pt > 75%)   Wheelchair   Type: Manual Max wheelchair distance: 70 ft Assist Level: Touching or steadying assistance (Pt > 75%)  Cognition Comprehension Comprehension assist level: Understands complex 90% of the time/cues 10% of the time  Expression Expression assist level: Expresses complex 90% of the time/cues < 10% of the time  Social Interaction Social Interaction assist level: Interacts appropriately 90% of the time - Needs monitoring or encouragement for participation or interaction.  Problem Solving Problem solving assist level: Solves complex 90% of the time/cues < 10% of the time  Memory Memory assist level: More than reasonable amount of time    Medical Problem List and Plan: 1.  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  DVT Prophylaxis/Anticoagulation: Pharmaceutical: Lovenox 3. Pain Management: continue tylenol qid with oxycodone and robaxin prn.   Encouraged to wean 4. Mood: LCSWS to follow for evaluation and support.  5. Neuropsych: This patient is capable of making decisions on his own behalf. 6. Skin/Wound Care: Monitor incision for healing.  7. Fluids/Electrolytes/Nutrition: Monitor I/Os  BMP within acceptable range on 4/26 8. ABLA: Added iron supplement with supper.   Hb 9.5 on 4/26, not low enough to be causing orthostatic hypotension  Cont to monitor 9. Dizziness: Has had hypotension- d/c trazodone  Orthostatic vitals 114/65->78/48 lying to sitting, may need IVF if recurrent.   Encourage patient to increase fluid intake.  10. Hyperglycemia- last serum fasting glucose 115 -ok  Likely stress induced, HbA1c WNL    Cont to monitor 11. Hypoalbuminemia  Supplement initiated 4/26 12. Orthostatic hypotension, improved, patient taking well, by mouth, will not order IV fluids given. He is maintaining his pressure with TED hose LOS (Days) 3 A FACE TO FACE EVALUATION WAS PERFORMED  Erick Colace 11/30/2016 6:26 AM

## 2016-11-30 NOTE — Progress Notes (Signed)
Occupational Therapy Session Note  Patient Details  Name: Brandon Holloway MRN: [id number] Date of Birth: 1980-08-17  Today's Date: 11/30/2016 OT Individual Time: 4098-1191 OT Individual Time Calculation (min): 75 min    Short Term Goals: Week 1:  OT Short Term Goal 1 (Week 1): Pt will be able to use PFRW to take 5 steps to enter bathroom with close S.  OT Short Term Goal 2 (Week 1): Pt will be able to don pants with set up. OT Short Term Goal 3 (Week 1): pt will be able to don shirt with set up.  OT Short Term Goal 4 (Week 1): Pt will be able to bathe with AE with set up.  Skilled Therapeutic Interventions/Progress Updates:    Bathing and dressing sit to stand at the sink this session.  Min guard assist for stand pivot transfer bed to wheelchair with min assist for supine to sit prior to transfer.  He was able to integrate LH sponge for washing his back, the RUE at the shoulder and his LEs.  Reacher utilized for removal of socks, threading shorts, and drying off his feet.  He used the sockaide for donning his socks but was unable to successfully get them all the way over his heels.  Therapist assisted with this.  BP taken in supine to start session at 120/58.  It decreased to 110/72 in sitting.  Pt with minimal complaints of dizziness while completing bathing, but became more severe after sitting approximately 45 mins.  Returned to bed at conclusion of session with BP at 115/68 once back in bed.  Still needs min assist for moving the LLE into the bed.    Therapy Documentation Precautions:  Precautions Precautions: Fall Restrictions Weight Bearing Restrictions: Yes LUE Weight Bearing: Non weight bearing (can weightbear on platform RW) LLE Weight Bearing: Touchdown weight bearing  Pain: Pain Assessment Pain Assessment: 0-10 Pain Score: 3  Pain Type: Acute pain Pain Location: Leg Pain Orientation: Left Pain Descriptors / Indicators: Aching Pain Frequency: Constant Pain Onset:  On-going Pain Intervention(s): Medication (See eMAR) ADL: See Function Navigator for Current Functional Status.   Therapy/Group: Individual Therapy  Alver Leete OTR/L 11/30/2016, 12:34 PM

## 2016-12-01 ENCOUNTER — Inpatient Hospital Stay (HOSPITAL_COMMUNITY): Payer: BLUE CROSS/BLUE SHIELD

## 2016-12-01 MED ORDER — OXYCODONE HCL 5 MG PO TABS: 5.0000 mg | ORAL_TABLET | ORAL | Status: DC | PRN

## 2016-12-01 MED ORDER — TRAMADOL HCL 50 MG PO TABS: 50.0000 mg | ORAL_TABLET | Freq: Four times a day (QID) | ORAL | Status: DC | PRN

## 2016-12-01 NOTE — Progress Notes (Signed)
PHYSICAL MEDICINE & REHABILITATION     PROGRESS NOTE  Subjective/Complaints:  Per nursing, patient states that he does not need so much pain medication. Discussed normal orthostatic blood pressure values with patient. No IV necessary.  ROS: Denies CP, SOB, N/V/D.  Objective: Vital Signs: Blood pressure 102/63, pulse (!) 110, temperature 97.9 F (36.6 C), temperature source Oral, resp. rate 18, height  (1.803 m), weight 92.2 kg (203 lb 3.2 oz), SpO2 100 %. No results found. No results for input(s): WBC, HGB, HCT, PLT in the last 72 hours. No results for input(s): NA, K, CL, GLUCOSE, BUN, CREATININE, CALCIUM in the last 72 hours.  Invalid input(s): CO CBG (last 3)  No results for input(s): GLUCAP in the last 72 hours.  Wt Readings from Last 3 Encounters:  11/27/16 92.2 kg (203 lb 3.2 oz)  11/21/16 81.6 kg (180 lb)  11/04/16 86.6 kg (191 lb)    Physical Exam:  BP 102/63 (BP Location: Right Arm)   Pulse (!) 110   Temp 97.9 F (36.6 C) (Oral)   Resp 18   Ht  (1.803 m)   Wt 92.2 kg (203 lb 3.2 oz)   SpO2 100%   BMI 28.34 kg/m  Constitutional: He appears well-developed and well-nourished. NAD. HENT: Normocephalic and atraumatic.  Eyes: EOMI. No Discharge. Cardiovascular: RRR. No JVD. Respiratory: Effort normal and breath sounds normal.  GI: Soft. Bowel sounds are normal.  Musculoskeletal: He exhibits edema and tenderness.  Neurological: He is alert and oriented. Motor: RUE/RLE 5/5 proximal to distal LUE: 5/5 shoulder abduction, elbow/wrist braced, hand grip 4-/5 LLE: HF, KE 2/5, ADF/PF 4/5 (unchanged, pain inhibition)  Skin: Skin is warm and dry. No evidence of tenting  LUE braced LLE with dressing c/d/i  Psychiatric: He has a normal mood and affect. His behavior is normal.    Assessment/Plan: 1. Functional deficits secondary to multi-ortho which require 3+ hours per day of interdisciplinary therapy in a comprehensive inpatient rehab  setting. Physiatrist is providing close team supervision and 24 hour management of active medical problems listed below. Physiatrist and rehab team continue to assess barriers to discharge/monitor patient progress toward functional and medical goals.  Function:  Bathing Bathing position   Position: Wheelchair/chair at sink  Bathing parts Body parts bathed by patient: Left arm, Chest, Abdomen, Front perineal area, Buttocks, Right upper leg, Left upper leg Body parts bathed by helper: Right arm, Left arm, Chest, Abdomen, Front perineal area, Buttocks, Right upper leg, Left upper leg, Right lower leg, Left lower leg, Back (utilized LH sponge)  Bathing assist        Upper Body Dressing/Undressing Upper body dressing   What is the patient wearing?: Pull over shirt/dress     Pull over shirt/dress - Perfomed by patient: Thread/unthread right sleeve, Thread/unthread left sleeve, Put head through opening, Pull shirt over trunk          Upper body assist Assist Level: Supervision or verbal cues      Lower Body Dressing/Undressing Lower body dressing   What is the patient wearing?: Non-skid slipper socks, Shoes, Pants     Pants- Performed by patient: Thread/unthread right pants leg, Pull pants up/down, Thread/unthread left pants leg Pants- Performed by helper: Thread/unthread left pants leg Non-skid slipper socks- Performed by patient: Don/doff right sock Non-skid slipper socks- Performed by helper: Don/doff right sock, Don/doff left sock     Shoes - Performed by patient: Don/doff right shoe  Lower body assist        Toileting Toileting Toileting activity did not occur: No continent bowel/bladder event (using urinal, No BM) Toileting steps completed by patient: Adjust clothing prior to toileting, Performs perineal hygiene, Adjust clothing after toileting Toileting steps completed by helper: Adjust clothing after toileting Toileting Assistive Devices: Grab bar or rail   Toileting assist Assist level: Touching or steadying assistance (Pt.75%)   Transfers Chair/bed transfer   Chair/bed transfer method: Stand pivot Chair/bed transfer assist level: Touching or steadying assistance (Pt > 75%) Chair/bed transfer assistive device: Armrests     Locomotion Ambulation     Max distance: 25 ft Assist level: Touching or steadying assistance (Pt > 75%)   Wheelchair   Type: Manual Max wheelchair distance: 125 Assist Level: Touching or steadying assistance (Pt > 75%), Supervision or verbal cues  Cognition Comprehension Comprehension assist level: Understands complex 90% of the time/cues 10% of the time  Expression Expression assist level: Expresses complex 90% of the time/cues < 10% of the time  Social Interaction Social Interaction assist level: Interacts appropriately 90% of the time - Needs monitoring or encouragement for participation or interaction.  Problem Solving Problem solving assist level: Solves complex 90% of the time/cues < 10% of the time  Memory Memory assist level: More than reasonable amount of time    Medical Problem List and Plan: 1.  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  DVT Prophylaxis/Anticoagulation: Pharmaceutical: Lovenox 3. Pain Management: continue tylenol qid with oxycodone and robaxin prn.   Discontinued scheduled tramadol Reduced when necessary oxycodone 2 every 4 hours 4. Mood: LCSWS to follow for evaluation and support.  5. Neuropsych: This patient is capable of making decisions on his own behalf. 6. Skin/Wound Care: Monitor incision for healing.  7. Fluids/Electrolytes/Nutrition: Monitor I/Os  BMP within acceptable range on 4/26 8. ABLA: Added iron supplement with supper.   Hb 9.5 on 4/26, not low enough to be causing orthostatic hypotension  Cont to monitor 9. Dizziness: Has had hypotension- d/c trazodone  Orthostatic vitals Normalized. No IV fluids needed, discontinue  IV  Encourage patient to increase fluid intake.  10. Hyperglycemia- last serum fasting glucose 115 -ok  Likely stress induced, HbA1c WNL    Cont to monitor 11. Hypoalbuminemia  Supplement initiated 4/26  LOS (Days) 4 A FACE TO FACE EVALUATION WAS PERFORMED  Erick Colace 12/01/2016 10:24 AM

## 2016-12-01 NOTE — Progress Notes (Signed)
Occupational Therapy Session Note  Patient Details  Name: Arlow Spiers MRN: [id number] Date of Birth: Jun 23, 1981  Today's Date: 12/01/2016 OT Individual Time: 1100-1200 OT Individual Time Calculation (min): 60 min   Short Term Goals: Week 1:  OT Short Term Goal 1 (Week 1): Pt will be able to use PFRW to take 5 steps to enter bathroom with close S.  OT Short Term Goal 2 (Week 1): Pt will be able to don pants with set up. OT Short Term Goal 3 (Week 1): pt will be able to don shirt with set up.  OT Short Term Goal 4 (Week 1): Pt will be able to bathe with AE with set up.  Skilled Therapeutic Interventions/Progress Updates: ADL-retraining at shower level with emphasis on improved activity tolerance, functional mobility, adherence to precautions during BADL, functional mobility, and AE retraining.   Pt received supine in bed and receptive for BADL.   With setup to place RW, pt ambulated from bed to bathroom with close supervision, transferred to toilet and voided urine unassisted for clothing management.   Pt was then re-educated on goals of therapy to progress with BADL and was encouraged to shower using water barriers as needed (left arm and over top portion of LLE waterproof dressing).   Pt enjoyed showering using LH sponge to reach his feet and back and grab bar to stabilize and maintain WB precautions.   Pt then ambulated to w/c at sink and dressed sitting/standing using reacher as needed.   OT donned non-skids socks d/t time limitation at end of session.   Pt directed to request bathing at tub/shower unit to continue to progress during the following treatment week.  RN notified regarding showering.  Pt completed all BADL w/o binder or TEDs:  BP 118/83, HR 120 bpm at end of session with no complaints or evidence of fatigue during entire session.     Therapy Documentation Precautions:  Precautions Precautions: Fall Restrictions Weight Bearing Restrictions: Yes LUE Weight Bearing: Non weight  bearing LLE Weight Bearing: Touchdown weight bearing   Pain: Pain Assessment Pain Assessment: 0-10 Pain Score: 5  Pain Type: Acute pain Pain Location: Leg Pain Orientation: Left Pain Descriptors / Indicators: Aching Pain Frequency: Constant Pain Onset: On-going Pain Intervention(s): Medication (See eMAR)   ADL: ADL ADL Comments: refer to functional navigator   See Function Navigator for Current Functional Status.   Therapy/Group: Individual Therapy  Josia Cueva 12/01/2016, 12:10 PM
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
## 2016-12-02 ENCOUNTER — Inpatient Hospital Stay (HOSPITAL_COMMUNITY): Payer: BLUE CROSS/BLUE SHIELD | Admitting: Occupational Therapy

## 2016-12-02 ENCOUNTER — Inpatient Hospital Stay (HOSPITAL_COMMUNITY): Payer: BLUE CROSS/BLUE SHIELD | Admitting: Physical Therapy

## 2016-12-02 ENCOUNTER — Encounter (HOSPITAL_COMMUNITY): Payer: BLUE CROSS/BLUE SHIELD | Admitting: Psychology

## 2016-12-02 DIAGNOSIS — F4323 Adjustment disorder with mixed anxiety and depressed mood: Secondary | ICD-10-CM

## 2016-12-02 NOTE — Progress Notes (Signed)
Occupational Therapy Session Note  Patient Details  Name: Brandon Holloway MRN: [id number] Date of Birth: July 19, 1981  Today's Date: 12/02/2016 OT Individual Time: 0901-1000 OT Individual Time Calculation (min): 59 min   Short Term Goals: Week 1:  OT Short Term Goal 1 (Week 1): Pt will be able to use PFRW to take 5 steps to enter bathroom with close S.  OT Short Term Goal 2 (Week 1): Pt will be able to don pants with set up. OT Short Term Goal 3 (Week 1): pt will be able to don shirt with set up.  OT Short Term Goal 4 (Week 1): Pt will be able to bathe with AE with set up.  Skilled Therapeutic Interventions/Progress Updates:    OT treatment session focused on shower transfers, modified bathing/dressing, and dc planning. Pt reports he would like to go home today. Discussed plan of care, OT goals, and encouraged pt to stay at rehab to maximize independence and reach mod I self-care goals, pt agreed to think about it. OT covered R UE and incision with waterproof dressings, then pt ambulated into bathroom using hemi walker with min guard A to turn to sit onto tub bench. Set-up for bathing using long-handled sponge, and min guard A for sit<>stand to maintain TDWB LLE while reaching to wash buttocks. Stand-pivot to transfer out of shower with supervision. Set-up to obtain clothing with supervision to thread LEs into pants and sit<>stand to pull over hips. Min A to don L sock using sock-aid OT educated pt on one-handed strategy for donning R sock. Pt able to achieve figure four position with R LE and don sock without AE. Pt provided with home measurement sheet to help with safe dc planning and equipment recommendations. Pt left with call bell and needs met seated in wc.   Therapy Documentation Precautions:  Precautions Precautions: Fall Restrictions Weight Bearing Restrictions: Yes LUE Weight Bearing: Touch down weight bearing LLE Weight Bearing: Touchdown weight bearing Pain: Pain Assessment Pain  Assessment: 0-10 Pain Score: 3  Pain Type: Acute pain Pain Location: Leg Pain Orientation: Left Pain Descriptors / Indicators: Aching Pain Onset: On-going Pain Intervention(s): Repositioned ADL: ADL ADL Comments: refer to functional navigator  See Function Navigator for Current Functional Status.   Therapy/Group: Individual Therapy  Valma Cava 12/02/2016, 12:45 PM
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
## 2016-12-02 NOTE — Progress Notes (Signed)
Physical Therapy Session Note  Patient Details  Name: Brandon Holloway MRN: [id number] Date of Birth: 16-Nov-1980  Today's Date: 12/02/2016 PT Individual Time: 1400-1500 PT Individual Time Calculation (min): 60 min   Short Term Goals: Week 1:  PT Short Term Goal 1 (Week 1): Pt to perform supine<>sitting with supervision PT Short Term Goal 2 (Week 1): Pt to perform sitting balance independently X 5 min PT Short Term Goal 3 (Week 1): Pt to ambulate 50 ft with platform rw.  PT Short Term Goal 4 (Week 1): Pt to perform sit<>stand transfers with supervision.   Skilled Therapeutic Interventions/Progress Updates:    pt reports pain is "not bad" and agreeable to session focus on increased activity tolerance, ambulation, stair negotiation, and LE therex.    Pt negotiated 4 steps ascending backwards with PRW, PT to assist with positioning and steadying walker.  PT discussed with pt alternative options for stair negotiation with LUE NWB including sitting down and scooting up.  Pt elected to stick with the PRW for ease at home.  PT instructed pt in therex x8-10 reps on LLE for heel slides, hip abd/add, SLR, and IR/ER with breaks in between each exercise for pain management.  Gait training 2x100' throughout session with PRW and distant supervision.  Pt returned to room at end of session and requested privacy for toileting.  Agreed to call nursing prior to standing when finished.   Therapy Documentation Precautions:  Precautions Precautions: Fall Restrictions Weight Bearing Restrictions: Yes LUE Weight Bearing: Touch down weight bearing LLE Weight Bearing: Touchdown weight bearing   See Function Navigator for Current Functional Status.   Therapy/Group: Individual Therapy  Ladora Daniel Penven-Crew 12/02/2016, 4:28 PM
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
## 2016-12-02 NOTE — Progress Notes (Signed)
Physical Therapy Session Note  Patient Details  Name: Brandon Holloway MRN: [id number] Date of Birth: 12-Jan-1981  Today's Date: 12/02/2016 PT Individual Time: 1035-1204 PT Individual Time Calculation (min): 89 min  Short Term Goals: Week 1:  PT Short Term Goal 1 (Week 1): Pt to perform supine<>sitting with supervision PT Short Term Goal 2 (Week 1): Pt to perform sitting balance independently X 5 min PT Short Term Goal 3 (Week 1): Pt to ambulate 50 ft with platform rw.  PT Short Term Goal 4 (Week 1): Pt to perform sit<>stand transfers with supervision.   Skilled Therapeutic Interventions/Progress Updates:    Pt up in w/c upon arrival, agreeable to PT session. Ambulation, 160 ft X2 plus shorter distances throughout session. All with Lt platform walker and supervision. Up/down 3" step X3 and 6" X4 using platform rw and min assist. Pt holding rail and PT lifting rw up/down step. Transfers: sit<>stand supervision using platform rw from w/c, mat table, toilet.  Bed Mobility: supervision supine<>sitting on mat table using hooking technique to get supine. Exercise: supine: SAQ, heel slides, heel digs, Seated; LAQ, knee flexion, hamstring curls with manual resist, w/c propulsion 200 ft with Rt UE/LE. While in gym, pt reporting need to use bathroom, using rw to return to room from gym. Pt able to perform hygiene independently, standing at sink to wash hands with supervision. Pt requesting to sit EOB after session, all needs in reach.    Therapy Documentation Precautions:  Precautions Precautions: Fall Restrictions Weight Bearing Restrictions: Yes LUE Weight Bearing: Touch down weight bearing LLE Weight Bearing: Touchdown weight bearing  Pain: Pain Assessment Pain Score: 0-No pain   See Function Navigator for Current Functional Status.   Therapy/Group: Individual Therapy  Delton See, PT  12/02/2016, 11:15 AM

## 2016-12-03 ENCOUNTER — Inpatient Hospital Stay (HOSPITAL_COMMUNITY): Payer: BLUE CROSS/BLUE SHIELD | Admitting: Occupational Therapy

## 2016-12-03 ENCOUNTER — Ambulatory Visit (HOSPITAL_COMMUNITY): Payer: BLUE CROSS/BLUE SHIELD | Admitting: Physical Therapy

## 2016-12-03 ENCOUNTER — Inpatient Hospital Stay (HOSPITAL_COMMUNITY): Payer: BLUE CROSS/BLUE SHIELD

## 2016-12-03 NOTE — Progress Notes (Signed)
Physical Therapy Session Note  Patient Details  Name: Brandon Holloway MRN: [id number] Date of Birth: 11/14/1980  Today's Date: 12/03/2016 PT Individual Time: 0900-0930 PT Individual Time Calculation (min): 30 min   Short Term Goals: Week 1:  PT Short Term Goal 1 (Week 1): Pt to perform supine<>sitting with supervision PT Short Term Goal 2 (Week 1): Pt to perform sitting balance independently X 5 min PT Short Term Goal 3 (Week 1): Pt to ambulate 50 ft with platform rw.  PT Short Term Goal 4 (Week 1): Pt to perform sit<>stand transfers with supervision.   Skilled Therapeutic Interventions/Progress Updates:    Pt expressed eagerness to discharge today or tomorrow at the latest - reports he spoke with Dr. Allena Katz this morning. Reviewed information in regards to goals and plan for stair negotiation, etc and pt denies concerns. Pt declines practicing stairs this session and states he plans to have his "brothers" carry him up the stairs but states he has improved using the platform RW on stairs. Pt completed bed mobility to get to EOB modified independent managing LLE using hooking technique and extra time. Completed basic sit <> stands and transfers at modified independent level maintaining precautions without cues including from a rocking recliner (lower surface furniture) using PFRW. Pt able to gait > 120' with PFRW and extra time due to fatigue including tiled and carpeted surfaces to simulate home environment mobility. Pt managed LLE legrest and propelled w/c back to room modified independent. Pt made modified independent for basic bed <> w/c transfers (notified NT and documented on safety plan also) and will discuss with primary OT for clearance for toilet transfers in upcoming session. Discussed d/c planning with CSW as well and ordered DME.  Therapy Documentation Precautions:  Precautions Precautions: Fall Restrictions Weight Bearing Restrictions: Yes LUE Weight Bearing: NWB through wrist; WBAT  through elbow for platform RW LLE Weight Bearing: Touchdown weight bearing   Pain:  Denies pain. Reports just being tired from not sleeping well in this hospital bed.    See Function Navigator for Current Functional Status.   Therapy/Group: Individual Therapy  Karolee Stamps Darrol Poke, PT, DPT  12/03/2016, 9:48 AM

## 2016-12-03 NOTE — Progress Notes (Signed)
[place] PHYSICAL MEDICINE & REHABILITATION     PROGRESS NOTE  Subjective/Complaints:  Pt slept well overnight.  He states he does not have pain and he is doing better.   ROS: Denies CP, SOB, N/V/D.  Objective: Vital Signs: Blood pressure (!) 108/56, pulse (!) 103, temperature 98.9 F (37.2 C), temperature source Oral, resp. rate 17, height  (1.803 m), weight 92.2 kg (203 lb 3.2 oz), SpO2 100 %. No results found. No results for input(s): WBC, HGB, HCT, PLT in the last 72 hours. No results for input(s): NA, K, CL, GLUCOSE, BUN, CREATININE, CALCIUM in the last 72 hours.  Invalid input(s): CO CBG (last 3)  No results for input(s): GLUCAP in the last 72 hours.  Wt Readings from Last 3 Encounters:  11/27/16 92.2 kg (203 lb 3.2 oz)  11/21/16 81.6 kg (180 lb)  11/04/16 86.6 kg (191 lb)    Physical Exam:  BP (!) 108/56 (BP Location: Right Arm)   Pulse (!) 103   Temp 98.9 F (37.2 C) (Oral)   Resp 17   Ht  (1.803 m)   Wt 92.2 kg (203 lb 3.2 oz)   SpO2 100%   BMI 28.34 kg/m  Constitutional: He appears well-developed and well-nourished. NAD. HENT: Normocephalic and atraumatic.  Eyes: EOMI. No Discharge. Cardiovascular: RRR. No JVD. Respiratory: Effort normal and breath sounds normal.  GI: Soft. Bowel sounds are normal.  Musculoskeletal: He exhibits edema and tenderness.  Neurological: He is alert and oriented. Motor: RUE/RLE 5/5 proximal to distal LUE: 5/5 shoulder abduction, elbow/wrist braced, hand grip 4-/5 LLE: HF, KE 2/5, ADF/PF 4/5 (stable, pain inhibition)  Skin: Skin is warm and dry. No evidence of tenting  LUE braced LLE with dressing c/d/i  Psychiatric: He has a normal mood and affect. His behavior is normal.    Assessment/Plan: 1. Functional deficits secondary to multi-ortho which require 3+ hours per day of interdisciplinary therapy in a comprehensive inpatient rehab setting. Physiatrist is providing close team supervision and 24 hour management  of active medical problems listed below. Physiatrist and rehab team continue to assess barriers to discharge/monitor patient progress toward functional and medical goals.  Function:  Bathing Bathing position   Position: Shower  Bathing parts Body parts bathed by patient: Chest, Abdomen, Front perineal area, Right upper leg, Left upper leg, Right lower leg, Left lower leg Body parts bathed by helper: Right arm  Bathing assist Assist Level: Set up, Touching or steadying assistance(Pt > 75%)   Set up : To obtain items  Upper Body Dressing/Undressing Upper body dressing   What is the patient wearing?: Pull over shirt/dress     Pull over shirt/dress - Perfomed by patient: Thread/unthread right sleeve, Thread/unthread left sleeve, Put head through opening, Pull shirt over trunk          Upper body assist Assist Level: Set up   Set up : To obtain clothing/put away  Lower Body Dressing/Undressing Lower body dressing   What is the patient wearing?: Pants, Non-skid slipper socks     Pants- Performed by patient: Thread/unthread right pants leg, Thread/unthread left pants leg, Pull pants up/down Pants- Performed by helper: Thread/unthread left pants leg Non-skid slipper socks- Performed by patient: Don/doff right sock Non-skid slipper socks- Performed by helper: Don/doff left sock     Shoes - Performed by patient: Don/doff right shoe            Lower body assist Assist for lower body dressing: Touching or steadying assistance (Pt >  75%)      Toileting Toileting Toileting activity did not occur: No continent bowel/bladder event (using urinal, No BM) Toileting steps completed by patient: Adjust clothing prior to toileting, Performs perineal hygiene, Adjust clothing after toileting Toileting steps completed by helper: Adjust clothing after toileting Toileting Assistive Devices: Grab bar or rail  Toileting assist Assist level: Supervision or verbal cues   Transfers Chair/bed  transfer   Chair/bed transfer method: Ambulatory Chair/bed transfer assist level: Supervision or verbal cues Chair/bed transfer assistive device: Armrests, Patent attorney     Max distance: 160 ft Assist level: Supervision or verbal cues   Wheelchair   Type: Manual Max wheelchair distance: 200 ft Assist Level: No help, No cues, assistive device, takes more than reasonable amount of time  Cognition Comprehension Comprehension assist level: Follows complex conversation/direction with no assist  Expression Expression assist level: Expresses complex ideas: With no assist  Social Interaction Social Interaction assist level: Interacts appropriately with others - No medications needed.  Problem Solving Problem solving assist level: Solves complex problems: Recognizes & self-corrects  Memory Memory assist level: Complete Independence: No helper    Medical Problem List and Plan: 1.  Gait disorder and limitation in self-care secondary to multi-ortho  Cont CIR 2.  DVT Prophylaxis/Anticoagulation: Pharmaceutical: Lovenox 3. Pain Management: continue tylenol qid with oxycodone and robaxin prn.   Discontinued scheduled tramadol, decreased prn oxycodone   Improving 4. Mood: LCSWS to follow for evaluation and support.   Discussed with Neuropsych, appreciate eval 5. Neuropsych: This patient is capable of making decisions on his own behalf. 6. Skin/Wound Care: Monitor incision for healing.  7. Fluids/Electrolytes/Nutrition: Monitor I/Os  BMP within acceptable range on 4/26 8. ABLA: Added iron supplement with supper.   Hb 9.5 on 4/26  Labs refused, encouraged pt  Cont to monitor 9. Dizziness: Has had hypotension- d/ced trazodone  Improving  Encourage patient to increase fluid intake.  10. Hyperglycemia-   Likely stress induced  HbA1c WNL  Cont to monitor 11. Hypoalbuminemia  Supplement initiated 4/26  LOS (Days) 6 A FACE TO FACE EVALUATION WAS PERFORMED  Ankit Karis Juba 12/03/2016 8:40 AM

## 2016-12-03 NOTE — Progress Notes (Signed)
Occupational Therapy Session Note  Patient Details  Name: Brandon Holloway MRN: [id number] Date of Birth: January 16, 1981  Today's Date: 12/03/2016 OT Individual Time: 1006-1101 OT Individual Time Calculation (min): 55 min    Short Term Goals: Week 1:  OT Short Term Goal 1 (Week 1): Pt will be able to use PFRW to take 5 steps to enter bathroom with close S.  OT Short Term Goal 2 (Week 1): Pt will be able to don pants with set up. OT Short Term Goal 3 (Week 1): pt will be able to don shirt with set up.  OT Short Term Goal 4 (Week 1): Pt will be able to bathe with AE with set up.  Skilled Therapeutic Interventions/Progress Updates:    Pt completed wheelchair mobility to the tub/shower room with modified independence.  He was able to complete stand pivot transfer with use of the RW with platform with supervison.  He was able to self manage his LLE over the edge of the tub both getting in and out.  Practiced maneuvering items around the kitchen from wheelchair level with modified independence as well as recommending moving items from higher shelves to lower cabinets or counter tops to make easier to access at home.  Pt was able to manage his left foot rest as well.  Returned to room at end of session with pt left in wheelchair with call button and phone in reach.    Therapy Documentation Precautions:  Precautions Precautions: Fall Restrictions Weight Bearing Restrictions: Yes LUE Weight Bearing: Non weight bearing LLE Weight Bearing: Touchdown weight bearing  Pain: Pain Assessment Pain Assessment: 0-10 Pain Score: 2  Pain Type: Acute pain Pain Location: Arm Pain Orientation: Left Pain Descriptors / Indicators: Sore Pain Frequency: Intermittent Pain Onset: With Activity Patients Stated Pain Goal: 0 Pain Intervention(s): Medication (See eMAR) (sched tylenol, declined robaxin) ADL: See Function Navigator for Current Functional Status.   Therapy/Group: Individual Therapy  Brandon Holloway  OTR/L 12/03/2016, 12:56 PM

## 2016-12-03 NOTE — Progress Notes (Signed)
Physical Therapy Discharge Summary  Patient Details  Name: Brandon Holloway MRN: [id number] Date of Birth: 09-Dec-1980  Today's Date: 12/03/2016 PT Individual Time: 1252-1415 PT Individual Time Calculation (min): 83 min    Patient has met 10 of 10 long term goals due to improved activity tolerance, improved balance, increased strength, increased range of motion, decreased pain and ability to compensate for deficits.  Patient to discharge at an ambulatory level (w/c use for longer distances)  Modified Independent.   Patient's mother will reportedly be available to provide assistance as needed. PT reports that he is unsure if he will stay at his mother's home or his own house at D/C. The patient's mother was not available for any family education to be performed. She did confirm that she will be available to provide physical assistance at discharge if needed.    Recommendation:  Patient will benefit from ongoing skilled PT services in home health setting to continue to advance safe functional mobility, address ongoing impairments in strength, ROM/flexibility, gait, endurance to minimize fall risk.  Session:  Pt in bathroom upon arrival to room, pt agreeable to PT session. Pt ambulating 175 ft with Lt UE platform as well as shorter distances during session. Pt able to demonstrate ability to ambulate in tight spaces (simulating home environment). Pt is consistent with weightbearing restrictions throughout session. HEP reviewed and handout provided to pt including; ankle pumps, quad sets, SAQ, heel digs, seated heel slides, supine hip abd/add. Pt to stop any exercise that causes pain or discomfort. By end of PT session, pt reports feeling confident with D/C tomorrow and he denies any questions or concerns. Pt left sitting in w/c with all needs in reach.   Equipment: Lt platform rolling walker, w/c  Reasons for discharge: treatment goals met  Patient/family agrees with progress made and goals achieved:  Yes  PT Discharge Precautions/Restrictions Precautions Precautions: Fall Restrictions Weight Bearing Restrictions: Yes LUE Weight Bearing: Non weight bearing LLE Weight Bearing: Touchdown weight bearing Pain Denies pain.  Vision/Perception  Vision - History Baseline Vision: No visual deficits Vision - Assessment Eye Alignment: Within Functional Limits Perception Perception: Within Functional Limits Praxis Praxis: Intact  Cognition Overall Cognitive Status: Within Functional Limits for tasks assessed Arousal/Alertness: Awake/alert Orientation Level: Oriented X4 Memory: Appears intact Sensation Sensation Light Touch: Appears Intact Stereognosis: Appears Intact Hot/Cold: Appears Intact Proprioception: Appears Intact Coordination Gross Motor Movements are Fluid and Coordinated: Yes Fine Motor Movements are Fluid and Coordinated: Yes Heel Shin Test:  (unable to perform with Lt LE) Motor  Motor Motor: Within Functional Limits Motor - Discharge Observations: fair Lt quad activation  Mobility Transfers Sit to Stand: 6: Modified independent (Device/Increase time);From chair/3-in-1;With upper extremity assist;From toilet Stand to Sit: 6: Modified independent (Device/Increase time);With upper extremity assist;To chair/3-in-1;To toilet Trunk/Postural Assessment  Cervical Assessment Cervical Assessment: Within Functional Limits Thoracic Assessment Thoracic Assessment: Within Functional Limits Lumbar Assessment Lumbar Assessment: Within Functional Limits Postural Control Postural Control: Within Functional Limits  Balance Balance Balance Assessed: Yes Static Sitting Balance Static Sitting - Balance Support: No upper extremity supported Static Sitting - Level of Assistance: 7: Independent Dynamic Sitting Balance Dynamic Sitting - Balance Support: No upper extremity supported;During functional activity Dynamic Sitting - Level of Assistance: 7: Independent Static Standing  Balance Static Standing - Balance Support: During functional activity;No upper extremity supported Static Standing - Level of Assistance: 6: Modified independent (Device/Increase time) Static Standing - Comment/# of Minutes: using platform walker for stability.  Dynamic Standing Balance Dynamic Standing - Balance  Support: Right upper extremity supported;Left upper extremity supported Dynamic Standing - Level of Assistance: 6: Modified independent (Device/Increase time) Dynamic Standing - Comments: using platform walker for stability Extremity Assessment  RUE Assessment RUE Assessment: Within Functional Limits LUE Assessment LUE Assessment: Exceptions to [facility] LUE Strength LUE Overall Strength Comments: Splinted, able to move and place on rw without assistance.  RLE Assessment RLE Assessment: Within Functional Limits LLE Strength Left Knee Flexion: 3+/5 Left Knee Extension: 3+/5 Left Ankle Dorsiflexion: 5/5 Left Ankle Plantar Flexion: 5/5   See Function Navigator for Current Functional Status.  Linard Millers, PT 12/03/2016, 3:59 PM

## 2016-12-03 NOTE — Progress Notes (Signed)
Occupational Therapy Discharge Summary  Patient Details  Name: Brandon Holloway MRN: [id number] Date of Birth: [birth year]  Today's Date: 12/03/2016 OT Individual Time: 1446-1530 OT Individual Time Calculation (min): 44 min   Session Note:  Pt completed transfer to the walk-in shower with use of the RW with platform on the left side, with modified independence.  He was able to complete all bathing with use of a LH sponge for reaching his feet and the right under arm with modified independence as well.  Completed transfer out to the wheelchair for dressing with overall modified independence and use of a sockaide for donning the left sock.  Finished session with completion of oral hygiene in sitting and transfer back to the bed, both at modified independent level.  Call button and phone in reach at end of session.     Patient has met 8 of 8 long term goals due to improved balance and ability to compensate for deficits.  Patient to discharge at overall Modified Independent level.  Patient's care partner unavailable to provide the necessary physical assistance at discharge.    Reasons goals not met: NA  Recommendation:  Pt is currently modified independent for most selfcare tasks.  Feel he does not need further OT at this time as he has learned to compensate based on his weightbearing limitations using AE PRN.   Equipment: tub bench  Reasons for discharge: treatment goals met and discharge from hospital  Patient/family agrees with progress made and goals achieved: Yes  OT Discharge Precautions/Restrictions  Precautions Precautions: Fall Restrictions Weight Bearing Restrictions: Yes LUE Weight Bearing: Non weight bearing LLE Weight Bearing: Touchdown weight bearing  Vital Signs Therapy Vitals Temp: 98.5 F (36.9 C) Temp Source: Oral Pulse Rate: (!) 119 Resp: 19 BP: 122/79 Patient Position (if appropriate): Sitting Oxygen Therapy SpO2: 100 % O2 Device: Not Delivered Pain Pain  Assessment Pain Assessment: Faces Pain Score: 2  Faces Pain Scale: Hurts a little bit Pain Type: Acute pain Pain Location: Leg Pain Orientation: Left Pain Descriptors / Indicators: Discomfort Pain Frequency: Intermittent Pain Onset: With Activity Patients Stated Pain Goal: 0 Pain Intervention(s): Repositioned ADL ADL ADL Comments: refer to functional navigator Vision/Perception  Vision- Assessment Eye Alignment: Within Functional Limits Perception Perception: Within Functional Limits Praxis Praxis: Intact  Cognition Overall Cognitive Status: Within Functional Limits for tasks assessed Arousal/Alertness: Awake/alert Orientation Level: Oriented X4 Memory: Appears intact Sensation Sensation Light Touch: Appears Intact Stereognosis: Appears Intact Hot/Cold: Appears Intact Proprioception: Appears Intact Coordination Gross Motor Movements are Fluid and Coordinated: Yes Fine Motor Movements are Fluid and Coordinated: Yes Heel Shin Test:  (unable to perform with Lt LE) Motor  Motor Motor: Within Functional Limits Motor - Discharge Observations: fair Lt quad activation Mobility  Transfers Transfers: Sit to Stand;Stand to Sit Sit to Stand: 6: Modified independent (Device/Increase time);From chair/3-in-1;With upper extremity assist;From toilet Stand to Sit: 6: Modified independent (Device/Increase time);With upper extremity assist;To chair/3-in-1;To toilet  Trunk/Postural Assessment  Cervical Assessment Cervical Assessment: Within Functional Limits Thoracic Assessment Thoracic Assessment: Within Functional Limits Lumbar Assessment Lumbar Assessment: Within Functional Limits Postural Control Postural Control: Within Functional Limits  Balance Balance Balance Assessed: Yes Static Sitting Balance Static Sitting - Balance Support: No upper extremity supported Static Sitting - Level of Assistance: 7: Independent Dynamic Sitting Balance Dynamic Sitting - Balance Support:  No upper extremity supported;During functional activity Dynamic Sitting - Level of Assistance: 7: Independent Reach (Patient is able to reach ___ inches to right, left, forward, back): yes Static Standing Balance  Static Standing - Balance Support: During functional activity;No upper extremity supported Static Standing - Level of Assistance: 6: Modified independent (Device/Increase time) Static Standing - Comment/# of Minutes: using platform walker for stability.  Dynamic Standing Balance Dynamic Standing - Balance Support: Right upper extremity supported;Left upper extremity supported Dynamic Standing - Level of Assistance: 6: Modified independent (Device/Increase time) Dynamic Standing - Comments: using platform walker for stability Extremity/Trunk Assessment RUE Assessment RUE Assessment: Within Functional Limits LUE Assessment LUE Assessment: Exceptions to [facility] LUE Strength LUE Overall Strength Comments: Pt with full AROM shoulder flexion.  Has long arm splint over distally elbow to MPs.  Can actively move MPs Mitchell within constraints of the cast.     See Function Navigator for Current Functional Status.  Josefine Fuhr  OTR/L 12/03/2016, 4:00 PM

## 2016-12-04 MED ORDER — ACETAMINOPHEN 325 MG PO TABS: 650.0000 mg | ORAL_TABLET | Freq: Four times a day (QID) | ORAL | Status: DC | PRN

## 2016-12-04 MED ORDER — ASPIRIN EC 325 MG PO TBEC: 325.0000 mg | DELAYED_RELEASE_TABLET | Freq: Every day | ORAL | 0 refills | Status: AC

## 2016-12-04 NOTE — Discharge Instructions (Signed)
Inpatient Rehab Discharge Instructions  Brandon Holloway Discharge date and time:  12/04/16  Activities/Precautions/ Functional Status: Activity: no lifting, driving, or strenuous exercise  till cleared by MD Diet: regular diet Wound Care: keep wound clean and dry   Functional status:  ___ No restrictions     ___ Walk up steps independently ___ 24/7 supervision/assistance   ___ Walk up steps with assistance _X__ Intermittent supervision/assistance  ___ Bathe/dress independently _X__ Walk with walker    ___ Bathe/dress with supervision ___ Walk Independently    _X__ Shower transfer with supervision ___ Walk with assistance    ___ Shower with assistance __X_ No alcohol     ___ Return to work/school ________  COMMUNITY REFERRALS UPON DISCHARGE:   Home Health:   PT          Agency:  [facility] Phone:  [phone] Medical Equipment/Items Ordered: 18"x16" lightweight wheelchair with basic cushion; rolling walker with left platform; tub bench  Agency/Supplier:  [facility]         Phone:  [phone]  Special Instructions:  1. Touch down weight on left leg. No weight on left wrist.   My questions have been answered and I understand these instructions. I will adhere to these goals and the provided educational materials after my discharge from the hospital.  Patient/Caregiver Signature _______________________________ Date __________  Clinician Signature _______________________________________ Date __________  Please bring this form and your medication list with you to all your follow-up doctor's appointments.

## 2016-12-04 NOTE — Progress Notes (Signed)
Staples removed. Pt tolerated well.

## 2016-12-04 NOTE — Progress Notes (Signed)
Social Work Discharge Note  The overall goal for the admission was met for:   Discharge location: Yes - home   Length of Stay: Yes - 6 days  Discharge activity level: Yes - modified independent  Home/community participation: Yes  Services provided included: MD, RD, PT, OT, RN, Pharmacy, Neuropsych and SW  Financial Services: Private Insurance: [employer]  Follow-up services arranged: Home Health: PT from [place]  DME: 18"x16" lightweight w/c with elevating leg rests; basic cushion; left platform rolling walker; tub transfer bench from [place] and Patient/Family has no preference for HH/DME agencies  Comments (or additional information): Pt to return to his home with family to assist and support as needed.  Patient/Family verbalized understanding of follow-up arrangements: Yes  Individual responsible for coordination of the follow-up plan: pt with intermittent support/assistance from his family  Confirmed correct DME delivered: Trey Sailors 12/04/2016    Keone Kamer, Silvestre Mesi
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
## 2016-12-04 NOTE — Progress Notes (Signed)
Pt discharged home with personal belongings.  P. Love PA-C provided discharge paperwork.  Pt and family at bedside with no questions about medications and activity.
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
## 2016-12-05 ENCOUNTER — Telehealth: Payer: Self-pay | Admitting: *Deleted
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
## 2016-12-12 ENCOUNTER — Other Ambulatory Visit: Payer: Self-pay | Admitting: Orthopedic Surgery

## 2016-12-12 ENCOUNTER — Encounter (HOSPITAL_BASED_OUTPATIENT_CLINIC_OR_DEPARTMENT_OTHER): Payer: Self-pay | Admitting: *Deleted

## 2016-12-17 ENCOUNTER — Ambulatory Visit (HOSPITAL_BASED_OUTPATIENT_CLINIC_OR_DEPARTMENT_OTHER)
Admission: RE | Admit: 2016-12-17 | Discharge: 2016-12-17 | Disposition: A | Discharge status: Home / Self Care | Payer: BLUE CROSS/BLUE SHIELD | Source: Ambulatory Visit | Attending: Orthopedic Surgery | Admitting: Orthopedic Surgery

## 2016-12-17 ENCOUNTER — Encounter (HOSPITAL_BASED_OUTPATIENT_CLINIC_OR_DEPARTMENT_OTHER): Payer: Self-pay | Admitting: Anesthesiology

## 2016-12-17 ENCOUNTER — Encounter (HOSPITAL_BASED_OUTPATIENT_CLINIC_OR_DEPARTMENT_OTHER)
Admission: RE | Disposition: A | Discharge status: Home / Self Care | Payer: Self-pay | Source: Ambulatory Visit | Attending: Orthopedic Surgery

## 2016-12-17 ENCOUNTER — Ambulatory Visit (HOSPITAL_BASED_OUTPATIENT_CLINIC_OR_DEPARTMENT_OTHER): Payer: BLUE CROSS/BLUE SHIELD | Admitting: Anesthesiology

## 2016-12-17 DIAGNOSIS — Z7982 Long term (current) use of aspirin: Secondary | ICD-10-CM | POA: Insufficient documentation

## 2016-12-17 DIAGNOSIS — S52572A Other intraarticular fracture of lower end of left radius, initial encounter for closed fracture: Secondary | ICD-10-CM | POA: Insufficient documentation

## 2016-12-17 HISTORY — DX: Other specified symptoms and signs involving the digestive system and abdomen: R19.8

## 2016-12-17 HISTORY — PX: OPEN REDUCTION INTERNAL FIXATION (ORIF) DISTAL RADIAL FRACTURE: SHX5989

## 2016-12-17 HISTORY — DX: Dysphagia, unspecified: R13.10

## 2016-12-17 HISTORY — DX: Unspecified fracture of the lower end of left radius, initial encounter for closed fracture: S52.502A

## 2016-12-17 SURGERY — OPEN REDUCTION INTERNAL FIXATION (ORIF) DISTAL RADIUS FRACTURE
Anesthesia: Regional | Site: Wrist | Laterality: Left

## 2016-12-17 MED ORDER — ONDANSETRON HCL 4 MG/2ML IJ SOLN
INTRAMUSCULAR | Status: AC
  Filled 2016-12-17: qty 2

## 2016-12-17 MED ORDER — FENTANYL CITRATE (PF) 100 MCG/2ML IJ SOLN
INTRAMUSCULAR | Status: DC | PRN
  Administered 2016-12-17: 50 ug via INTRAVENOUS
  Administered 2016-12-17: 100 ug via INTRAVENOUS

## 2016-12-17 MED ORDER — MIDAZOLAM HCL 2 MG/2ML IJ SOLN
INTRAMUSCULAR | Status: AC
  Filled 2016-12-17: qty 2

## 2016-12-17 MED ORDER — MIDAZOLAM HCL 2 MG/2ML IJ SOLN
1.0000 mg | INTRAMUSCULAR | Status: DC | PRN
  Administered 2016-12-17: 2 mg via INTRAVENOUS

## 2016-12-17 MED ORDER — MIDAZOLAM HCL 5 MG/5ML IJ SOLN
INTRAMUSCULAR | Status: DC | PRN
  Administered 2016-12-17: 2 mg via INTRAVENOUS

## 2016-12-17 MED ORDER — ROPIVACAINE HCL 5 MG/ML IJ SOLN
INTRAMUSCULAR | Status: DC | PRN
  Administered 2016-12-17: 30 mL via PERINEURAL

## 2016-12-17 MED ORDER — CEFAZOLIN SODIUM-DEXTROSE 2-4 GM/100ML-% IV SOLN
2.0000 g | INTRAVENOUS | Status: AC
  Administered 2016-12-17: 2 g via INTRAVENOUS

## 2016-12-17 MED ORDER — DEXAMETHASONE SODIUM PHOSPHATE 4 MG/ML IJ SOLN
INTRAMUSCULAR | Status: DC | PRN
  Administered 2016-12-17: 10 mg via INTRAVENOUS

## 2016-12-17 MED ORDER — ONDANSETRON HCL 4 MG/2ML IJ SOLN
INTRAMUSCULAR | Status: DC | PRN
  Administered 2016-12-17: 4 mg via INTRAVENOUS

## 2016-12-17 MED ORDER — FENTANYL CITRATE (PF) 100 MCG/2ML IJ SOLN
INTRAMUSCULAR | Status: AC
  Filled 2016-12-17: qty 2

## 2016-12-17 MED ORDER — SCOPOLAMINE 1 MG/3DAYS TD PT72: 1.0000 | MEDICATED_PATCH | Freq: Once | TRANSDERMAL | Status: DC | PRN

## 2016-12-17 MED ORDER — LACTATED RINGERS IV SOLN
INTRAVENOUS | Status: DC
  Administered 2016-12-17 (×2): via INTRAVENOUS

## 2016-12-17 MED ORDER — OXYCODONE-ACETAMINOPHEN 5-325 MG PO TABS: ORAL_TABLET | ORAL | 0 refills | Status: AC

## 2016-12-17 MED ORDER — PROPOFOL 10 MG/ML IV BOLUS
INTRAVENOUS | Status: DC | PRN
  Administered 2016-12-17: 200 mg via INTRAVENOUS

## 2016-12-17 MED ORDER — FENTANYL CITRATE (PF) 100 MCG/2ML IJ SOLN
50.0000 ug | INTRAMUSCULAR | Status: DC | PRN
  Administered 2016-12-17: 100 ug via INTRAVENOUS

## 2016-12-17 MED ORDER — CEFAZOLIN SODIUM-DEXTROSE 2-4 GM/100ML-% IV SOLN
INTRAVENOUS | Status: AC
  Filled 2016-12-17: qty 100

## 2016-12-17 MED ORDER — LIDOCAINE 2% (20 MG/ML) 5 ML SYRINGE
INTRAMUSCULAR | Status: AC
  Filled 2016-12-17: qty 5

## 2016-12-17 MED ORDER — DEXAMETHASONE SODIUM PHOSPHATE 10 MG/ML IJ SOLN
INTRAMUSCULAR | Status: AC
  Filled 2016-12-17: qty 1

## 2016-12-17 MED ORDER — PROPOFOL 10 MG/ML IV BOLUS
INTRAVENOUS | Status: AC
  Filled 2016-12-17: qty 20

## 2016-12-17 MED ORDER — CHLORHEXIDINE GLUCONATE 4 % EX LIQD: 60.0000 mL | Freq: Once | CUTANEOUS | Status: DC

## 2016-12-17 SURGICAL SUPPLY — 67 items
BANDAGE ACE 3X5.8 VEL STRL LF (GAUZE/BANDAGES/DRESSINGS) ×3 IMPLANT
BIT DRILL 2.0 LNG QUCK RELEASE (BIT) ×1 IMPLANT
BIT DRILL 2.8 QUICK RELEASE (BIT) ×1 IMPLANT
BLADE SURG 15 STRL LF DISP TIS (BLADE) ×2 IMPLANT
BLADE SURG 15 STRL SS (BLADE) ×4
BNDG ESMARK 4X9 LF (GAUZE/BANDAGES/DRESSINGS) ×3 IMPLANT
BNDG GAUZE ELAST 4 BULKY (GAUZE/BANDAGES/DRESSINGS) ×3 IMPLANT
BNDG PLASTER X FAST 3X3 WHT LF (CAST SUPPLIES) ×30 IMPLANT
CHLORAPREP W/TINT 26ML (MISCELLANEOUS) ×3 IMPLANT
CORDS BIPOLAR (ELECTRODE) ×3 IMPLANT
COVER BACK TABLE 60X90IN (DRAPES) ×3 IMPLANT
COVER MAYO STAND STRL (DRAPES) ×3 IMPLANT
CUFF TOURNIQUET SINGLE 18IN (TOURNIQUET CUFF) ×3 IMPLANT
CUFF TOURNIQUET SINGLE 24IN (TOURNIQUET CUFF) IMPLANT
DRAPE EXTREMITY T 121X128X90 (DRAPE) ×3 IMPLANT
DRAPE OEC MINIVIEW 54X84 (DRAPES) ×3 IMPLANT
DRAPE SURG 17X23 STRL (DRAPES) ×3 IMPLANT
DRILL 2.0 LNG QUICK RELEASE (BIT) ×3
DRILL 2.8 QUICK RELEASE (BIT) ×3
GAUZE SPONGE 4X4 12PLY STRL (GAUZE/BANDAGES/DRESSINGS) ×3 IMPLANT
GAUZE XEROFORM 1X8 LF (GAUZE/BANDAGES/DRESSINGS) ×3 IMPLANT
GLOVE BIO SURGEON STRL SZ7 (GLOVE) ×6 IMPLANT
GLOVE BIO SURGEON STRL SZ7.5 (GLOVE) ×3 IMPLANT
GLOVE BIOGEL PI IND STRL 8 (GLOVE) ×1 IMPLANT
GLOVE BIOGEL PI IND STRL 8.5 (GLOVE) ×1 IMPLANT
GLOVE BIOGEL PI INDICATOR 8 (GLOVE) ×2
GLOVE BIOGEL PI INDICATOR 8.5 (GLOVE) ×2
GLOVE SURG ORTHO 8.0 STRL STRW (GLOVE) ×3 IMPLANT
GOWN STRL REUS W/ TWL LRG LVL3 (GOWN DISPOSABLE) IMPLANT
GOWN STRL REUS W/ TWL XL LVL3 (GOWN DISPOSABLE) ×1 IMPLANT
GOWN STRL REUS W/TWL LRG LVL3 (GOWN DISPOSABLE)
GOWN STRL REUS W/TWL XL LVL3 (GOWN DISPOSABLE) ×11 IMPLANT
GUIDEWIRE ORTHO 0.054X6 (WIRE) ×9 IMPLANT
NEEDLE HYPO 25X1 1.5 SAFETY (NEEDLE) IMPLANT
NS IRRIG 1000ML POUR BTL (IV SOLUTION) ×3 IMPLANT
PACK BASIN DAY SURGERY FS (CUSTOM PROCEDURE TRAY) ×3 IMPLANT
PAD CAST 3X4 CTTN HI CHSV (CAST SUPPLIES) ×1 IMPLANT
PADDING CAST ABS 4INX4YD NS (CAST SUPPLIES) ×2
PADDING CAST ABS COTTON 4X4 ST (CAST SUPPLIES) ×1 IMPLANT
PADDING CAST COTTON 3X4 STRL (CAST SUPPLIES) ×2
PLATE ACULOC 2 VDR STD LT (Plate) ×3 IMPLANT
SCREW CORT FT 20X2.3XLCK HEX (Screw) ×2 IMPLANT
SCREW CORTICAL LOCKING 2.3X20M (Screw) ×12 IMPLANT
SCREW FX20X2.3XSMTH LCK NS CRT (Screw) ×4 IMPLANT
SCREW HEX 3.5X15 NLCKG STRL (Screw) ×1 IMPLANT
SCREW HEX 3.5X15MM (Screw) ×3 IMPLANT
SCREW HEXALOBE NON-LOCK 3.5X14 (Screw) ×3 IMPLANT
SCREW HEXALOBE NON-LOCK 3.5X16 (Screw) ×3 IMPLANT
SLEEVE SCD COMPRESS KNEE MED (MISCELLANEOUS) ×3 IMPLANT
SLING ARM FOAM STRAP LRG (SOFTGOODS) ×3 IMPLANT
STOCKINETTE 4X48 STRL (DRAPES) ×3 IMPLANT
SUCTION FRAZIER HANDLE 10FR (MISCELLANEOUS)
SUCTION TUBE FRAZIER 10FR DISP (MISCELLANEOUS) IMPLANT
SUT ETHILON 3 0 PS 1 (SUTURE) IMPLANT
SUT ETHILON 4 0 PS 2 18 (SUTURE) ×3 IMPLANT
SUT VIC AB 2-0 SH 27 (SUTURE)
SUT VIC AB 2-0 SH 27XBRD (SUTURE) IMPLANT
SUT VIC AB 3-0 PS1 18 (SUTURE)
SUT VIC AB 3-0 PS1 18XBRD (SUTURE) IMPLANT
SUT VICRYL 4-0 PS2 18IN ABS (SUTURE) ×3 IMPLANT
SYR BULB 3OZ (MISCELLANEOUS) ×3 IMPLANT
SYR CONTROL 10ML LL (SYRINGE) IMPLANT
TOWEL OR 17X24 6PK STRL BLUE (TOWEL DISPOSABLE) ×6 IMPLANT
TOWEL OR NON WOVEN STRL DISP B (DISPOSABLE) ×3 IMPLANT
TUBE CONNECTING 20'X1/4 (TUBING)
TUBE CONNECTING 20X1/4 (TUBING) IMPLANT
UNDERPAD 30X30 (UNDERPADS AND DIAPERS) IMPLANT

## 2016-12-17 NOTE — Anesthesia Procedure Notes (Signed)
Procedure Name: LMA Insertion Date/Time: 12/17/2016 1:22 PM Performed by: Genevieve NorlanderLINKA, Mahalie Kanner L Pre-anesthesia Checklist: Patient identified, Emergency Drugs available, Suction available, Patient being monitored and Timeout performed Patient Re-evaluated:Patient Re-evaluated prior to inductionOxygen Delivery Method: Circle system utilized Preoxygenation: Pre-oxygenation with 100% oxygen Intubation Type: IV induction Ventilation: Mask ventilation without difficulty LMA: LMA inserted LMA Size: 5.0 Number of attempts: 1 Airway Equipment and Method: Bite block Placement Confirmation: positive ETCO2 Tube secured with: Tape Dental Injury: Teeth and Oropharynx as per pre-operative assessment
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
## 2016-12-17 NOTE — Anesthesia Postprocedure Evaluation (Signed)
Anesthesia Post Note  Patient: Brandon EpleyDavid Holloway  Procedure(s) Performed: Procedure(s) (LRB): OPEN REDUCTION INTERNAL FIXATION (ORIF) LEFT DISTAL RADIAL FRACTURE (Left)  Patient location during evaluation: PACU Anesthesia Type: Regional Level of consciousness: awake and alert Pain management: pain level controlled Vital Signs Assessment: post-procedure vital signs reviewed and stable Respiratory status: spontaneous breathing, nonlabored ventilation and respiratory function stable Cardiovascular status: blood pressure returned to baseline and stable Postop Assessment: no signs of nausea or vomiting Anesthetic complications: no       Last Vitals:  Vitals:   12/17/16 1545 12/17/16 1600  BP: 119/75 (!) 131/98  Pulse: (!) 106 100  Resp: 18 18  Temp:      Last Pain:  Vitals:   12/17/16 1545  TempSrc:   PainSc: 0-No pain                 Lowella CurbWarren Ray Endi Lagman

## 2016-12-17 NOTE — Discharge Instructions (Addendum)
° °  ° ° ° °[facility] Instructions °Hand Surgery ° °Wound Care: °Keep your hand elevated above the level of your heart.  Do not allow it to dangle by your side.  Keep the dressing dry and do not remove it unless your doctor advises you to do so.  He will usually change it at the time of your post-op visit.  Moving your fingers is advised to stimulate circulation but will depend on the site of your surgery.  If you have a splint applied, your doctor will advise you regarding movement. ° °Activity: °Do not drive or operate machinery today.  Rest today and then you may return to your normal activity and work as indicated by your physician. ° °Diet:  °Drink liquids today or eat a light diet.  You may resume a regular diet tomorrow.   ° °General expectations: °Pain for two to three days. °Fingers may become slightly swollen. ° °Call your doctor if any of the following occur: °Severe pain not relieved by pain medication. °Elevated temperature. °Dressing soaked with blood. °Inability to move fingers. °White or bluish color to fingers. ° ° °Regional Anesthesia Blocks ° °1. Numbness or the inability to move the "blocked" extremity may last from 3-48 hours after placement. The length of time depends on the medication injected and your individual response to the medication. If the numbness is not going away after 48 hours, call your surgeon. ° °2. The extremity that is blocked will need to be protected until the numbness is gone and the  Strength has returned. Because you cannot feel it, you will need to take extra care to avoid injury. Because it may be weak, you may have difficulty moving it or using it. You may not know what position it is in without looking at it while the block is in effect. ° °3. For blocks in the legs and feet, returning to weight bearing and walking needs to be done carefully. You will need to wait until the numbness is entirely gone and the strength has returned. You should be able to move your leg  and foot normally before you try and bear weight or walk. You will need someone to be with you when you first try to ensure you do not fall and possibly risk injury. ° °4. Bruising and tenderness at the needle site are common side effects and will resolve in a few days. ° °5. Persistent numbness or new problems with movement should be communicated to the surgeon or the [facility] ([phone])/ [facility] ([phone]). ° ° ° °Post Anesthesia Home Care Instructions ° °Activity: °Get plenty of rest for the remainder of the day. A responsible individual must stay with you for 24 hours following the procedure.  °For the next 24 hours, DO NOT: °-Drive a car °-Operate machinery °-Drink alcoholic beverages °-Take any medication unless instructed by your physician °-Make any legal decisions or sign important papers. ° °Meals: °Start with liquid foods such as gelatin or soup. Progress to regular foods as tolerated. Avoid greasy, spicy, heavy foods. If nausea and/or vomiting occur, drink only clear liquids until the nausea and/or vomiting subsides. Call your physician if vomiting continues. ° °Special Instructions/Symptoms: °Your throat may feel dry or sore from the anesthesia or the breathing tube placed in your throat during surgery. If this causes discomfort, gargle with warm salt water. The discomfort should disappear within 24 hours. ° °If you had a scopolamine patch placed behind your ear for   the management of post- operative nausea and/or vomiting: ° °1. The medication in the patch is effective for 72 hours, after which it should be removed.  Wrap patch in a tissue and discard in the trash. Wash hands thoroughly with soap and water. °2. You may remove the patch earlier than 72 hours if you experience unpleasant side effects which may include dry mouth, dizziness or visual disturbances. °3. Avoid touching the patch. Wash your hands with soap and water after contact with the patch. °  ° ° °

## 2016-12-17 NOTE — Op Note (Signed)
I assisted Surgeon(s) and Role:    * Betha LoaKuzma, Kevin, MD - Primary    Cindee Salt* Rona Tomson, MD - Assisting on the Procedure(s): OPEN REDUCTION INTERNAL FIXATION (ORIF) LEFT DISTAL RADIAL FRACTURE on 12/17/2016.  I provided assistance on this case as follows: approach, identification of the fracture and dislocation, debridement, reduction, stabilization of the fracture, application of the plate and screws, closure of the wound and application of the dressings and splint. I was present for the entire case.  Electronically signed by: Nicki ReaperKUZMA,Rihan Schueler R, MD Date: 12/17/2016 Time: 3:16 PM

## 2016-12-17 NOTE — Anesthesia Procedure Notes (Addendum)
Anesthesia Regional Block: Supraclavicular block   Pre-Anesthetic Checklist: ,, timeout performed, Correct Patient, Correct Site, Correct Laterality, Correct Procedure, Correct Position, site marked, Risks and benefits discussed,  Surgical consent,  Pre-op evaluation,  At surgeon's request and post-op pain management  Laterality: Left  Prep: Dura Prep       Needles:  Injection technique: Single-shot  Needle Type: Stimiplex     Needle Length: 9cm  Needle Gauge: 21     Additional Needles:   Procedures: ultrasound guided,,,,,,,,  Narrative:  Start time: 12/17/2016 10:57 AM End time: 12/17/2016 11:03 AM Injection made incrementally with aspirations every 5 mL.  Performed by: Personally  Anesthesiologist: Anitra LauthMILLER, WARREN RAY

## 2016-12-17 NOTE — Transfer of Care (Signed)
Immediate Anesthesia Transfer of Care Note  Patient: Brandon Holloway  Procedure(s) Performed: Procedure(s): OPEN REDUCTION INTERNAL FIXATION (ORIF) LEFT DISTAL RADIAL FRACTURE (Left)  Patient Location: PACU  Anesthesia Type:GA combined with regional for post-op pain  Level of Consciousness: sedated  Airway & Oxygen Therapy: Patient Spontanous Breathing and Patient connected to face mask oxygen  Post-op Assessment: Report given to RN and Post -op Vital signs reviewed and stable  Post vital signs: Reviewed and stable  Last Vitals:  Vitals:   12/17/16 1130 12/17/16 1519  BP: 123/76   Pulse: 86 92  Resp: 16 (!) 9  Temp:      Last Pain:  Vitals:   12/17/16 1019  TempSrc: Oral  PainSc: 0-No pain         Complications: No apparent anesthesia complications

## 2016-12-17 NOTE — Progress Notes (Signed)
Assisted Dr. Miller with left, ultrasound guided, supraclavicular block. Side rails up, monitors on throughout procedure. See vital signs in flow sheet. Tolerated Procedure well. 
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
## 2016-12-17 NOTE — Anesthesia Preprocedure Evaluation (Signed)
Anesthesia Evaluation  Patient identified by MRN, date of birth, ID band Patient awake    Reviewed: Allergy & Precautions, NPO status , Patient's Chart, lab work & pertinent test results  Airway Mallampati: II  TM Distance: >3 FB Neck ROM: Full    Dental no notable dental hx.    Pulmonary neg pulmonary ROS,    Pulmonary exam normal breath sounds clear to auscultation       Cardiovascular negative cardio ROS Normal cardiovascular exam Rhythm:Regular Rate:Normal     Neuro/Psych Depression negative neurological ROS  negative psych ROS   GI/Hepatic negative GI ROS, Neg liver ROS,   Endo/Other  negative endocrine ROS  Renal/GU negative Renal ROS     Musculoskeletal negative musculoskeletal ROS (+)   Abdominal   Peds  Hematology negative hematology ROS (+)   Anesthesia Other Findings   Reproductive/Obstetrics negative OB ROS                             Anesthesia Physical  Anesthesia Plan  ASA: II  Anesthesia Plan: General and Regional   Post-op Pain Management: GA combined w/ Regional for post-op pain   Induction: Intravenous  Airway Management Planned: LMA  Additional Equipment:   Intra-op Plan:   Post-operative Plan: Extubation in OR  Informed Consent: I have reviewed the patients History and Physical, chart, labs and discussed the procedure including the risks, benefits and alternatives for the proposed anesthesia with the patient or authorized representative who has indicated his/her understanding and acceptance.   Dental advisory given  Plan Discussed with: CRNA  Anesthesia Plan Comments:         Anesthesia Quick Evaluation

## 2016-12-17 NOTE — Brief Op Note (Signed)
12/17/2016  3:18 PM  PATIENT:  Vickie Epleyavid Howes  36 y.o. male  PRE-OPERATIVE DIAGNOSIS:  LEFT WRIST INTRA ARTICULAR FRACTURE  POST-OPERATIVE DIAGNOSIS:  LEFT WRIST INTRA ARTICULAR FRACTURE  PROCEDURE:  Procedure(s): OPEN REDUCTION INTERNAL FIXATION (ORIF) LEFT DISTAL RADIAL FRACTURE (Left)  SURGEON:  Surgeon(s) and Role:    * Betha LoaKuzma, Francely Craw, MD - Primary    * Cindee SaltKuzma, Gary, MD - Assisting  PHYSICIAN ASSISTANT:   ASSISTANTS: Cindee SaltGary Lam Bjorklund, MD   ANESTHESIA:   regional and general  EBL:  Total I/O In: 1600 [I.V.:1600] Out: -   BLOOD ADMINISTERED:none  DRAINS: none   LOCAL MEDICATIONS USED:  NONE  SPECIMEN:  No Specimen  DISPOSITION OF SPECIMEN:  N/A  COUNTS:  YES  TOURNIQUET:   Total Tourniquet Time Documented: Upper Arm (Left) - 87 minutes Total: Upper Arm (Left) - 87 minutes   DICTATION: .Note written in EPIC  PLAN OF CARE: Discharge to home after PACU  PATIENT DISPOSITION:  PACU - hemodynamically stable.

## 2016-12-18 ENCOUNTER — Encounter (HOSPITAL_BASED_OUTPATIENT_CLINIC_OR_DEPARTMENT_OTHER): Payer: Self-pay | Admitting: Orthopedic Surgery

## 2016-12-18 ENCOUNTER — Ambulatory Visit (INDEPENDENT_AMBULATORY_CARE_PROVIDER_SITE_OTHER): Payer: BLUE CROSS/BLUE SHIELD | Admitting: Orthopedic Surgery

## 2016-12-19 NOTE — Op Note (Signed)
Late entry for case on 12/17/16:   Intra-operative fluoroscopic images in the AP, lateral, and oblique views were taken and evaluated by myself.  Reduction and hardware placement were confirmed.  There was no intraarticular penetration of permanent hardware.

## 2016-12-27 ENCOUNTER — Encounter: Payer: BLUE CROSS/BLUE SHIELD | Admitting: Physical Medicine & Rehabilitation

## 2017-01-01 ENCOUNTER — Ambulatory Visit (INDEPENDENT_AMBULATORY_CARE_PROVIDER_SITE_OTHER): Payer: BLUE CROSS/BLUE SHIELD | Admitting: Orthopedic Surgery

## 2017-01-06 ENCOUNTER — Ambulatory Visit: Payer: BLUE CROSS/BLUE SHIELD | Admitting: Family Medicine

## 2017-01-09 ENCOUNTER — Encounter (INDEPENDENT_AMBULATORY_CARE_PROVIDER_SITE_OTHER): Payer: Self-pay | Admitting: Orthopedic Surgery

## 2017-01-09 ENCOUNTER — Ambulatory Visit (INDEPENDENT_AMBULATORY_CARE_PROVIDER_SITE_OTHER): Payer: BLUE CROSS/BLUE SHIELD | Admitting: Family

## 2017-01-09 ENCOUNTER — Ambulatory Visit (INDEPENDENT_AMBULATORY_CARE_PROVIDER_SITE_OTHER): Payer: BLUE CROSS/BLUE SHIELD

## 2017-01-09 VITALS — Ht 71.0 in | Wt 191.0 lb

## 2017-01-09 DIAGNOSIS — S8992XA Unspecified injury of left lower leg, initial encounter: Secondary | ICD-10-CM

## 2017-01-09 DIAGNOSIS — S72352A Displaced comminuted fracture of shaft of left femur, initial encounter for closed fracture: Secondary | ICD-10-CM

## 2017-01-09 DIAGNOSIS — M898X5 Other specified disorders of bone, thigh: Secondary | ICD-10-CM
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
## 2017-01-10 ENCOUNTER — Encounter: Payer: Self-pay | Admitting: Physical Medicine & Rehabilitation

## 2017-01-10 ENCOUNTER — Encounter
Payer: BLUE CROSS/BLUE SHIELD | Attending: Physical Medicine & Rehabilitation | Admitting: Physical Medicine & Rehabilitation

## 2017-01-10 VITALS — BP 113/77 | HR 100 | Resp 14

## 2017-01-10 DIAGNOSIS — T1490XS Injury, unspecified, sequela: Secondary | ICD-10-CM | POA: Diagnosis not present

## 2017-01-10 DIAGNOSIS — D62 Acute posthemorrhagic anemia: Secondary | ICD-10-CM | POA: Insufficient documentation

## 2017-01-10 DIAGNOSIS — Z9889 Other specified postprocedural states: Secondary | ICD-10-CM | POA: Insufficient documentation

## 2017-01-10 DIAGNOSIS — S7292XS Unspecified fracture of left femur, sequela: Secondary | ICD-10-CM

## 2017-01-10 DIAGNOSIS — S8992XA Unspecified injury of left lower leg, initial encounter: Secondary | ICD-10-CM

## 2017-01-10 DIAGNOSIS — R739 Hyperglycemia, unspecified: Secondary | ICD-10-CM

## 2017-01-10 DIAGNOSIS — R42 Dizziness and giddiness: Secondary | ICD-10-CM | POA: Insufficient documentation

## 2017-01-10 DIAGNOSIS — R269 Unspecified abnormalities of gait and mobility: Secondary | ICD-10-CM | POA: Insufficient documentation

## 2017-01-10 DIAGNOSIS — G8918 Other acute postprocedural pain: Secondary | ICD-10-CM

## 2017-01-10 DIAGNOSIS — R531 Weakness: Secondary | ICD-10-CM | POA: Diagnosis not present
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
## 2017-01-10 NOTE — Addendum Note (Signed)
Addended by: Maryla MorrowPATEL, ANKIT on: 01/10/2017 03:21 PM   Modules accepted: Level of Service

## 2017-01-23 DIAGNOSIS — S52572D Other intraarticular fracture of lower end of left radius, subsequent encounter for closed fracture with routine healing: Secondary | ICD-10-CM | POA: Diagnosis not present

## 2017-01-23 DIAGNOSIS — S72352D Displaced comminuted fracture of shaft of left femur, subsequent encounter for closed fracture with routine healing: Secondary | ICD-10-CM | POA: Diagnosis not present

## 2017-01-31 ENCOUNTER — Telehealth (INDEPENDENT_AMBULATORY_CARE_PROVIDER_SITE_OTHER): Payer: Self-pay | Admitting: Orthopedic Surgery

## 2017-01-31 NOTE — Telephone Encounter (Signed)
Returned call to [employer] Life[employer]iree with [employer] lm message to fax requested information. [phone]   Claim# [id number]

## 2017-02-12 ENCOUNTER — Ambulatory Visit (INDEPENDENT_AMBULATORY_CARE_PROVIDER_SITE_OTHER): Payer: BLUE CROSS/BLUE SHIELD | Admitting: Family

## 2017-11-12 IMAGING — CT CT WRIST*L* W/O CM
3 of 5 series · 9 of 34 positions shown, 11 images · non-contrast
Comparison: Radiographs from 11/21/2016

CLINICAL DATA: Left wrist fracture.

EXAM:
CT OF THE LEFT WRIST WITHOUT CONTRAST
TECHNIQUE: Multidetector CT imaging was performed according to the standard
protocol. Multiplanar CT image reconstructions were also generated.

[Series 5: upper ext 1.5 st · axial · 0.55mm/px · z∈[+198,+198]mm · 1 of 120 slices shown, 2 images]
[im 72/120  soft-tissue]
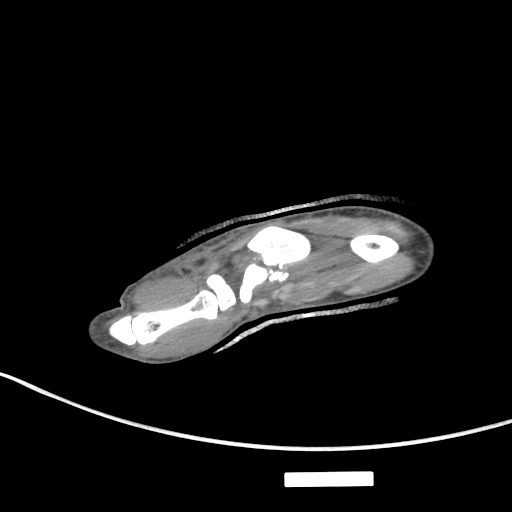
[im 72/120  bone]
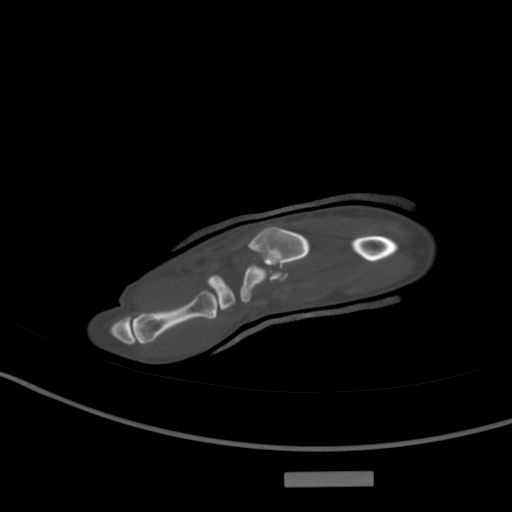

[Series 10: upper ext cor st · coronal · 0.35mm/px · 3 of 75 slices shown]
[im 15/75  bone]
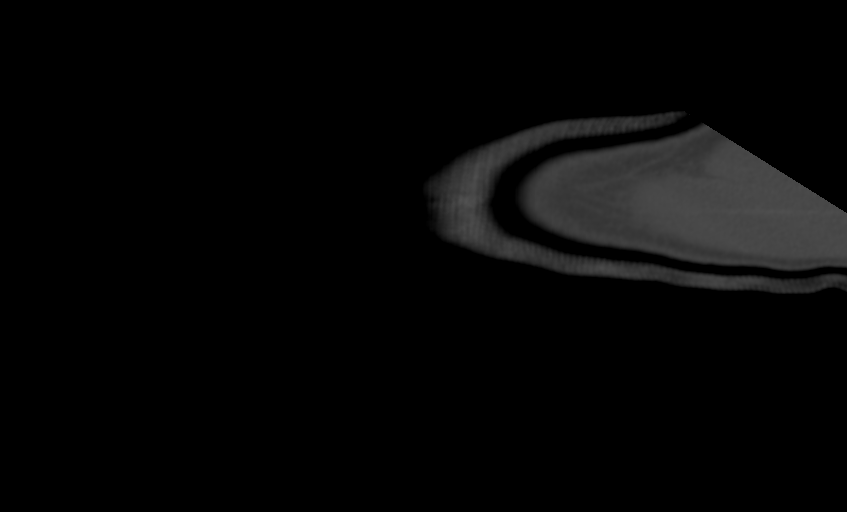
[im 30/75  bone]
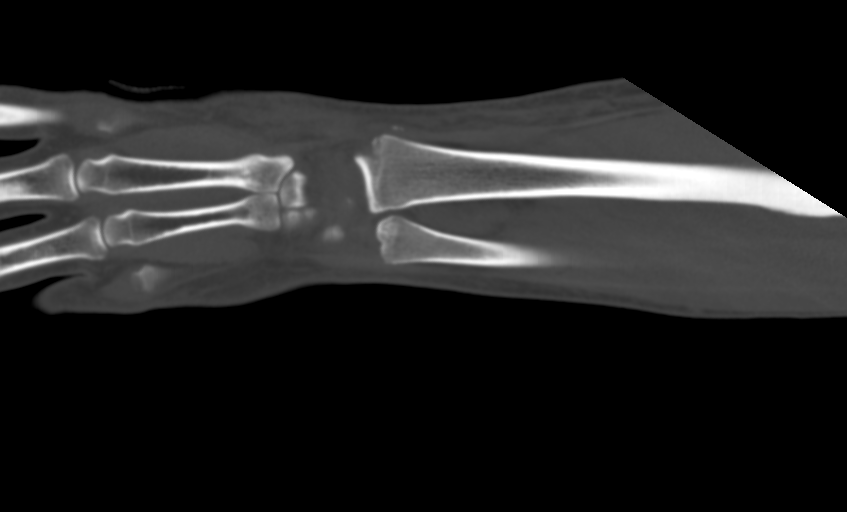
[im 45/75  bone]
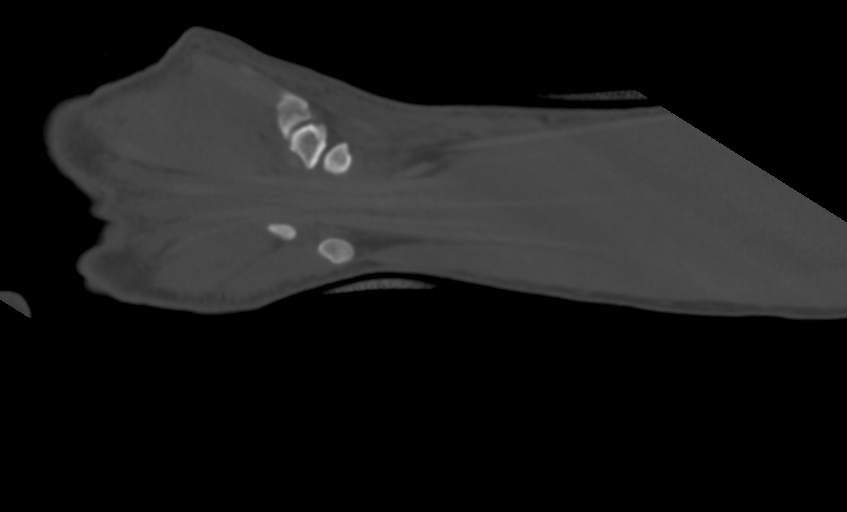

[Series 11: upper ext sag st · sagittal · 0.26mm/px · 5 of 219 slices shown, 6 images]
[im 97/219  bone]
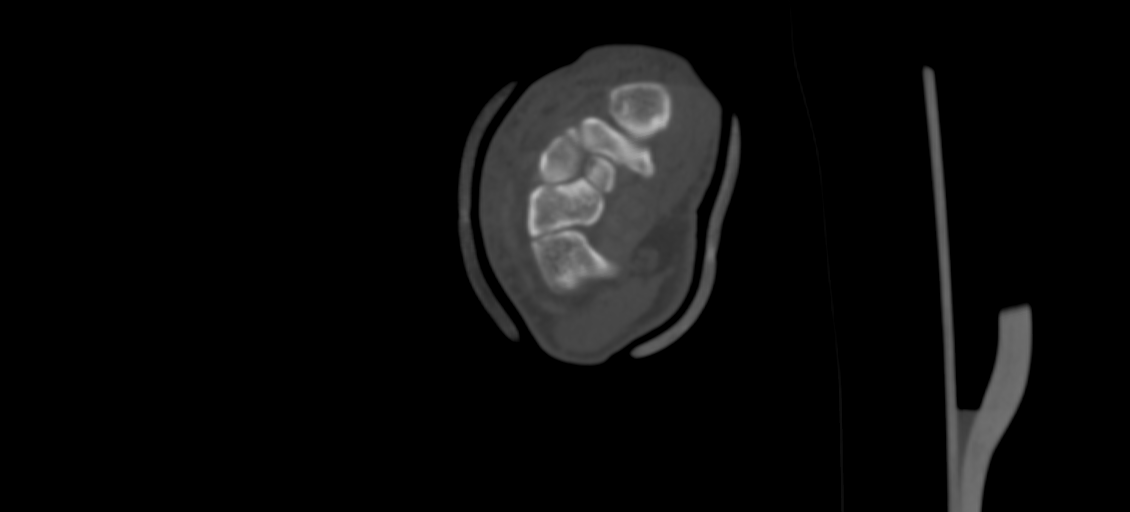
[im 103/219  bone]
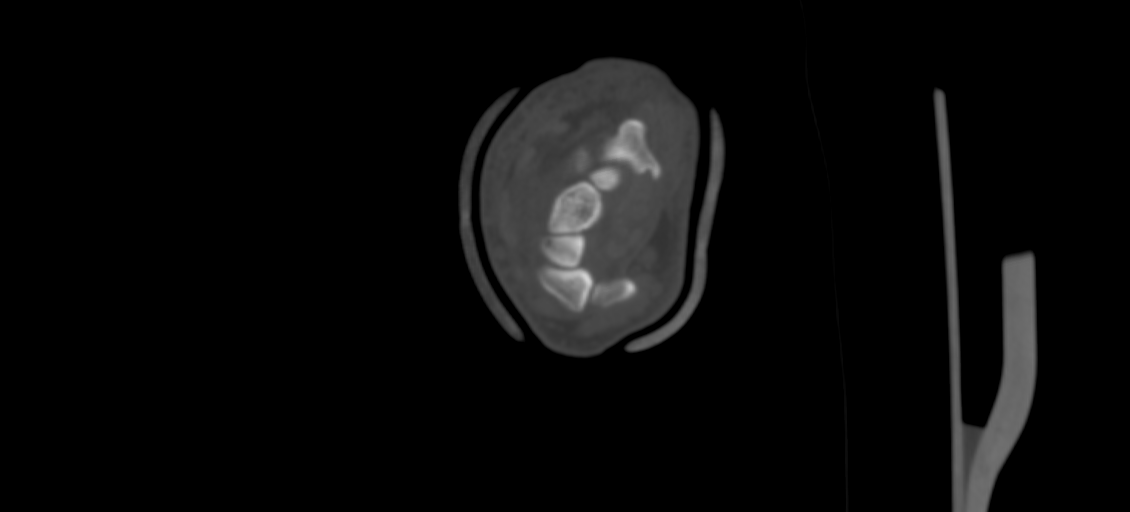
[im 110/219  soft-tissue]
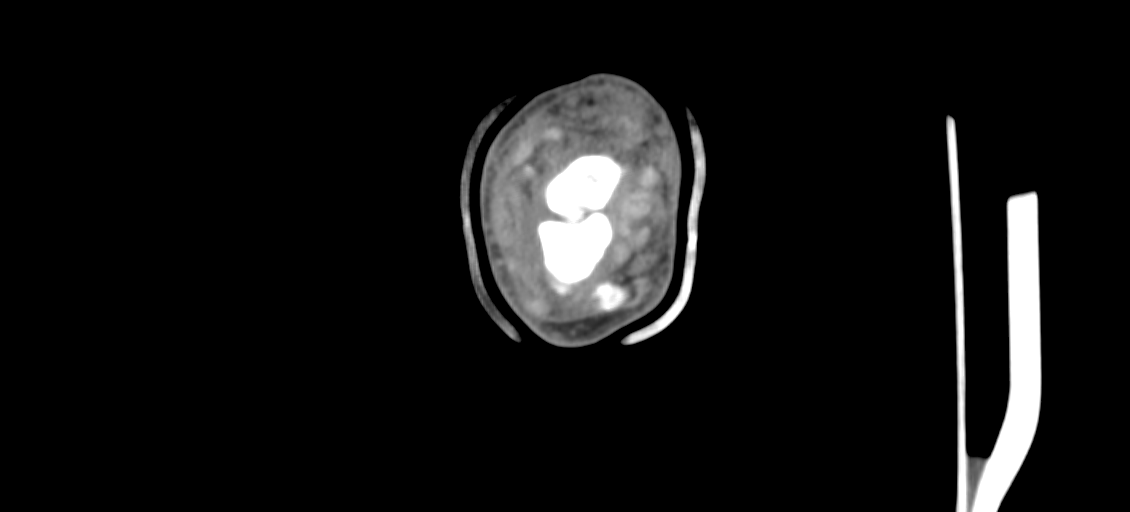
[im 110/219  bone]
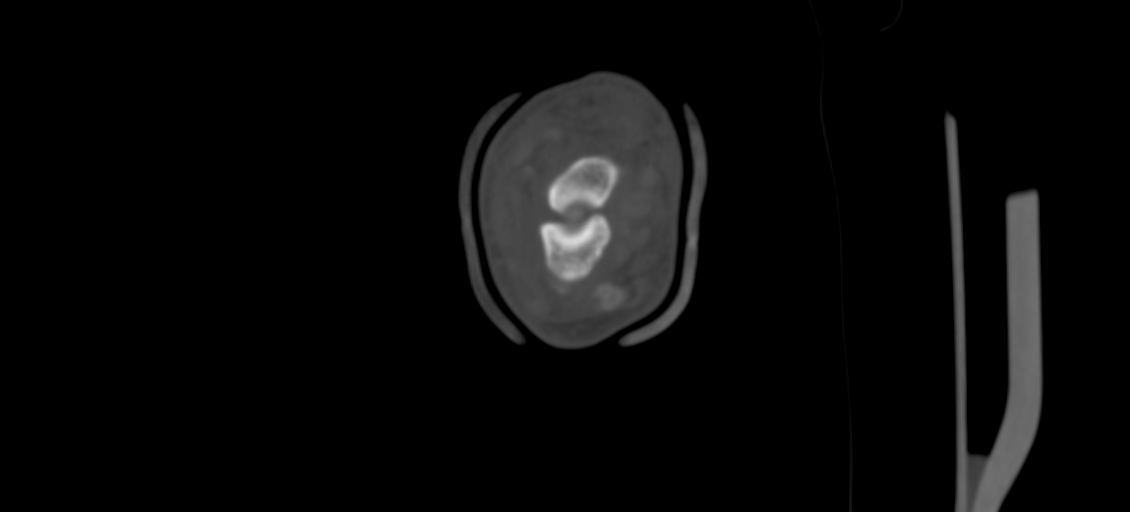
[im 116/219  bone]
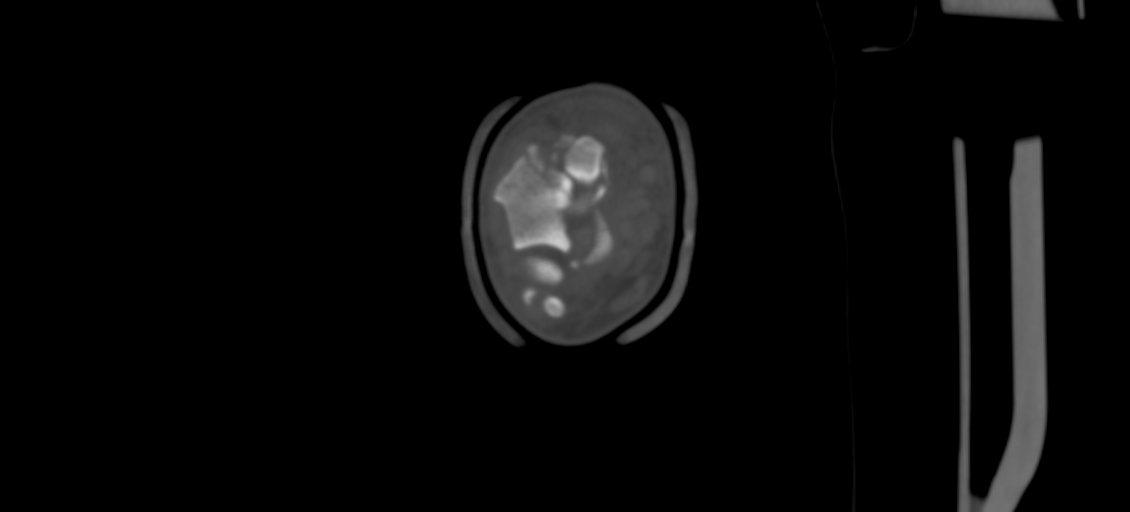
[im 122/219  bone]
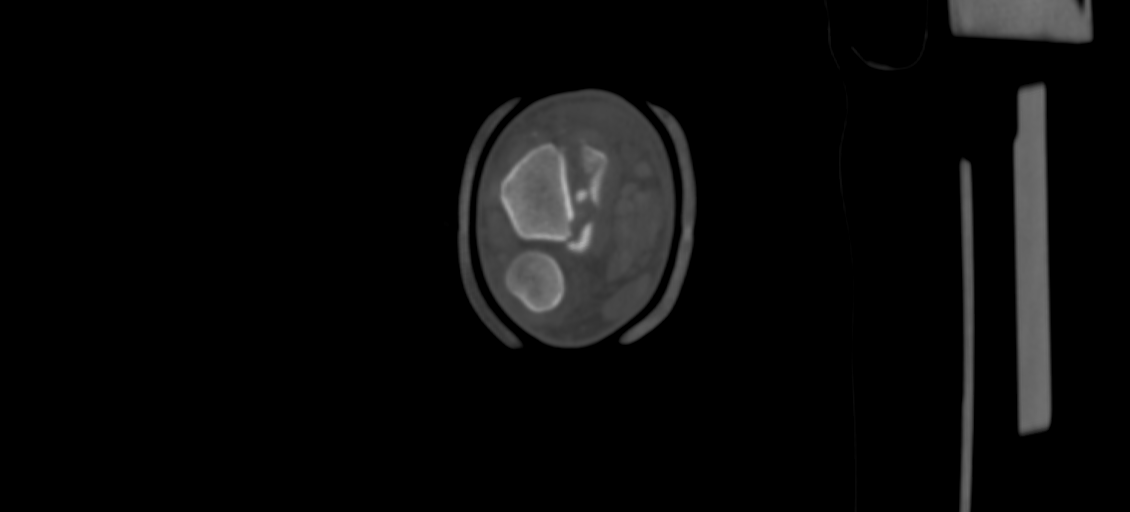

[9 of 34 positions shown; findings below may reference images not displayed]

FINDINGS: Bones/Joint/Cartilage

Comminuted volar Chai this fracture with multiple fragments of
various size from the distal radius, and volar displacement of
fragments along with the carpus. The fracture fragments are well
depicted on images 41 through 64 of series 14.

Transverse mildly displaced ulnar styloid fracture, image 33/8.

Slight bony ridging along the anterior lunate but without a discrete
fracture identified. Overall no fracture the carpal bones is seen.

Ligaments

Suboptimally assessed by CT.

Muscles and Tendons

No discrete tendon entrapment within the fracture planes.

Soft tissues

Subcutaneous edema along the wrist especially radially and dorsally.
IMPRESSION: 1. Comminuted volar Chai fracture with associated ulnar styloid
fracture. Numerous bony fragments are anteriorly displaced along
with the carpus.

## 2020-03-20 ENCOUNTER — Ambulatory Visit: Payer: Self-pay | Attending: Internal Medicine

## 2020-03-20 DIAGNOSIS — Z23 Encounter for immunization: Secondary | ICD-10-CM

## 2020-03-20 NOTE — Progress Notes (Signed)
   [facility]  Name:  Brandon Holloway    MRN: [id number] DOB: [birth year]  03/20/2020  Mr. [name] was observed post Covid-19 immunization for 15 minutes without incident. He was provided with Vaccine Information Sheet and instruction to access the V-Safe system.   Mr. [name] was instructed to call 911 with any severe reactions post vaccine: Marland Kitchen Difficulty breathing  . Swelling of face and throat  . A fast heartbeat  . A bad rash all over body  . Dizziness and weakness   Immunizations Administered    Name Date Dose VIS Date Route   Pfizer COVID-19 Vaccine 03/20/2020  2:38 PM 0.3 mL 09/29/2018 Intramuscular   Manufacturer: [employer], [employer]   Lot: [id number]   NDC: [address]-8

## 2020-04-17 ENCOUNTER — Ambulatory Visit: Payer: Self-pay
